# Patient Record
Sex: Female | Born: 1975 | Race: Black or African American | Hispanic: No | Marital: Married | Smoking: Current every day smoker
Health system: Southern US, Community
[De-identification: ages and names within clinical notes are randomized; demographics above are authoritative.]

## PROBLEM LIST (undated history)

## (undated) DIAGNOSIS — E119 Type 2 diabetes mellitus without complications: Secondary | ICD-10-CM

---

## 2019-05-07 ENCOUNTER — Ambulatory Visit: Payer: Self-pay | Admitting: Family Medicine

## 2019-05-08 ENCOUNTER — Ambulatory Visit: Payer: Self-pay | Admitting: Family Medicine

## 2019-05-12 ENCOUNTER — Encounter (HOSPITAL_COMMUNITY): Payer: Self-pay | Admitting: Emergency Medicine

## 2019-05-12 ENCOUNTER — Emergency Department (HOSPITAL_COMMUNITY)
Admission: EM | Admit: 2019-05-12 | Discharge: 2019-05-12 | Disposition: A | Discharge status: Home / Self Care | Payer: Medicaid Other | Attending: Emergency Medicine | Admitting: Emergency Medicine

## 2019-05-12 ENCOUNTER — Other Ambulatory Visit: Payer: Self-pay

## 2019-05-12 DIAGNOSIS — R739 Hyperglycemia, unspecified: Secondary | ICD-10-CM

## 2019-05-12 DIAGNOSIS — E1165 Type 2 diabetes mellitus with hyperglycemia: Secondary | ICD-10-CM | POA: Diagnosis present

## 2019-05-12 DIAGNOSIS — F17210 Nicotine dependence, cigarettes, uncomplicated: Secondary | ICD-10-CM | POA: Diagnosis not present

## 2019-05-12 HISTORY — DX: Type 2 diabetes mellitus without complications: E11.9

## 2019-05-12 LAB — CBC WITH DIFFERENTIAL/PLATELET
Abs Immature Granulocytes: 0.02 10*3/uL (ref 0.00–0.07)
Basophils Absolute: 0.1 10*3/uL (ref 0.0–0.1)
Basophils Relative: 1 %
Eosinophils Absolute: 0.1 10*3/uL (ref 0.0–0.5)
Eosinophils Relative: 2 %
HCT: 34.9 % — ABNORMAL LOW (ref 36.0–46.0)
Hemoglobin: 10.9 g/dL — ABNORMAL LOW (ref 12.0–15.0)
Immature Granulocytes: 0 %
Lymphocytes Relative: 42 %
Lymphs Abs: 2.9 10*3/uL (ref 0.7–4.0)
MCH: 27.7 pg (ref 26.0–34.0)
MCHC: 31.2 g/dL (ref 30.0–36.0)
MCV: 88.8 fL (ref 80.0–100.0)
Monocytes Absolute: 0.5 10*3/uL (ref 0.1–1.0)
Monocytes Relative: 7 %
Neutro Abs: 3.3 10*3/uL (ref 1.7–7.7)
Neutrophils Relative %: 48 %
Platelets: 264 10*3/uL (ref 150–400)
RBC: 3.93 MIL/uL (ref 3.87–5.11)
RDW: 14.4 % (ref 11.5–15.5)
WBC: 6.9 10*3/uL (ref 4.0–10.5)
nRBC: 0 % (ref 0.0–0.2)

## 2019-05-12 LAB — CBG MONITORING, ED: Glucose-Capillary: 179 mg/dL — ABNORMAL HIGH (ref 70–99)

## 2019-05-12 LAB — BASIC METABOLIC PANEL
Anion gap: 5 (ref 5–15)
BUN: 18 mg/dL (ref 6–20)
CO2: 23 mmol/L (ref 22–32)
Calcium: 8.2 mg/dL — ABNORMAL LOW (ref 8.9–10.3)
Chloride: 107 mmol/L (ref 98–111)
Creatinine, Ser: 0.87 mg/dL (ref 0.44–1.00)
GFR calc Af Amer: >60 mL/min (ref >60)
GFR calc non Af Amer: >60 mL/min (ref >60)
Glucose, Bld: 365 mg/dL — ABNORMAL HIGH (ref 70–99)
Potassium: 3.7 mmol/L (ref 3.5–5.1)
Sodium: 135 mmol/L (ref 135–145)

## 2019-05-12 MED ORDER — INSULIN ASPART 100 UNIT/ML ~~LOC~~ SOLN
10.0000 [IU] | Freq: Once | SUBCUTANEOUS | Status: AC
  Administered 2019-05-12: 10 [IU] via INTRAVENOUS
  Filled 2019-05-12: qty 1

## 2019-05-12 MED ORDER — METFORMIN HCL 500 MG PO TABS
1000.0000 mg | ORAL_TABLET | Freq: Once | ORAL | Status: AC
  Administered 2019-05-12: 1000 mg via ORAL
  Filled 2019-05-12: qty 2

## 2019-05-12 MED ORDER — SODIUM CHLORIDE 0.9 % IV BOLUS
1000.0000 mL | Freq: Once | INTRAVENOUS | Status: AC
  Administered 2019-05-12: 1000 mL via INTRAVENOUS

## 2019-05-12 MED ORDER — SODIUM CHLORIDE 0.9 % IV BOLUS
1000.0000 mL | Freq: Once | INTRAVENOUS | Status: AC
  Administered 2019-05-12: 02:00:00 1000 mL via INTRAVENOUS

## 2019-05-12 MED ORDER — METFORMIN HCL 1000 MG PO TABS: 1000.0000 mg | ORAL_TABLET | Freq: Two times a day (BID) | ORAL | 4 refills | Status: DC

## 2019-05-12 NOTE — ED Triage Notes (Signed)
[facility] - pt states she ran out of her metformin about 1 month ago. CBG 465

## 2019-05-12 NOTE — ED Provider Notes (Signed)
[facility] EMERGENCY DEPARTMENT Provider Note   CSN: [id number] Arrival date & time: 05/12/19  0100     History   Chief Complaint Chief Complaint  Patient presents with  . Hyperglycemia    HPI Destiny Roberts is a 43 y.o. female.     Patient presents to the emergency department for evaluation of elevated blood sugar.  Patient does have a history of diabetes, recently moved from [place] and does not have a doctor.  She ran out of her Glucophage.  Patient does endorse blurred vision, polydipsia, polyuria but has not had any other symptoms.  She denies fever, flu, COVID symptoms.  No abdominal pain, nausea or vomiting.     Past Medical History:  Diagnosis Date  . Diabetes mellitus without complication ([place])     There are no active problems to display for this patient.   Past Surgical History:  Procedure Laterality Date  . CESAREAN SECTION       OB History   No obstetric history on file.      Home Medications    Prior to Admission medications   Medication Sig Start Date End Date Taking? Authorizing Provider  metFORMIN (GLUCOPHAGE) 1000 MG tablet Take 1 tablet (1,000 mg total) by mouth 2 (two) times daily. 05/12/19   Orpah Greek, MD    Family History No family history on file.  Social History Social History   Tobacco Use  . Smoking status: Current Every Day Smoker    Packs/day: 0.50  . Smokeless tobacco: Never Used  Substance Use Topics  . Alcohol use: Not Currently  . Drug use: Never     Allergies   Amoxicillin   Review of Systems Review of Systems  Eyes: Positive for visual disturbance.  Endocrine: Positive for polydipsia and polyuria.  All other systems reviewed and are negative.    Physical Exam Updated Vital Signs BP 108/80   Pulse 79   Temp 98.2 F (36.8 C)   Resp 16   Ht 5\' 5"  (1.651 m)   Wt 83.9 kg   LMP 04/29/2019 (Exact Date)   SpO2 99%   BMI 30.79 kg/m   Physical Exam Vitals signs and nursing note  reviewed.  Constitutional:      General: She is not in acute distress.    Appearance: Normal appearance. She is well-developed.  HENT:     Head: Normocephalic and atraumatic.     Right Ear: Hearing normal.     Left Ear: Hearing normal.     Nose: Nose normal.  Eyes:     Conjunctiva/sclera: Conjunctivae normal.     Pupils: Pupils are equal, round, and reactive to light.  Neck:     Musculoskeletal: Normal range of motion and neck supple.  Cardiovascular:     Rate and Rhythm: Regular rhythm.     Heart sounds: S1 normal and S2 normal. No murmur. No friction rub. No gallop.   Pulmonary:     Effort: Pulmonary effort is normal. No respiratory distress.     Breath sounds: Normal breath sounds.  Chest:     Chest wall: No tenderness.  Abdominal:     General: Bowel sounds are normal.     Palpations: Abdomen is soft.     Tenderness: There is no abdominal tenderness. There is no guarding or rebound. Negative signs include Murphy's sign and McBurney's sign.     Hernia: No hernia is present.  Musculoskeletal: Normal range of motion.  Skin:    General: Skin is  warm and dry.     Findings: No rash.  Neurological:     Mental Status: She is alert and oriented to person, place, and time.     GCS: GCS eye subscore is 4. GCS verbal subscore is 5. GCS motor subscore is 6.     Cranial Nerves: No cranial nerve deficit.     Sensory: No sensory deficit.     Coordination: Coordination normal.  Psychiatric:        Speech: Speech normal.        Behavior: Behavior normal.        Thought Content: Thought content normal.      ED Treatments / Results  Labs (all labs ordered are listed, but only abnormal results are displayed) Labs Reviewed  CBC WITH DIFFERENTIAL/PLATELET - Abnormal; Notable for the following components:      Result Value   Hemoglobin 10.9 (*)    HCT 34.9 (*)    All other components within normal limits  BASIC METABOLIC PANEL - Abnormal; Notable for the following components:    Glucose, Bld 365 (*)    Calcium 8.2 (*)    All other components within normal limits  CBG MONITORING, ED - Abnormal; Notable for the following components:   Glucose-Capillary 179 (*)    All other components within normal limits  URINALYSIS, ROUTINE W REFLEX MICROSCOPIC    EKG None  Radiology No results found.  Procedures Procedures (including critical care time)  Medications Ordered in ED Medications  metFORMIN (GLUCOPHAGE) tablet 1,000 mg (has no administration in time range)  sodium chloride 0.9 % bolus 1,000 mL (0 mLs Intravenous Stopped 05/12/19 0334)    Followed by  sodium chloride 0.9 % bolus 1,000 mL (0 mLs Intravenous Stopped 05/12/19 0254)  insulin aspart (novoLOG) injection 10 Units (10 Units Intravenous Given 05/12/19 0143)     Initial Impression / Assessment and Plan / ED Course  I have reviewed the triage vital signs and the nursing notes.  Pertinent labs & imaging results that were available during my care of the patient were reviewed by me and considered in my medical decision making (see chart for details).        Patient presents to the emergency department for evaluation of elevated blood sugar secondary to running out of her metformin.  Blood sugar 465 at arrival, no evidence of ketosis.  Anion gap is normal.  Patient administered IV fluids and insulin with resolution of her hyperglycemia.  Patient otherwise has a normal exam and vital signs are normal.  She will be discharged, restart metformin.  Final Clinical Impressions(s) / ED Diagnoses   Final diagnoses:  Hyperglycemia    ED Discharge Orders         Ordered    metFORMIN (GLUCOPHAGE) 1000 MG tablet  2 times daily     05/12/19 0413           Gilda Crease, MD 05/12/19 [phone]

## 2019-05-13 ENCOUNTER — Ambulatory Visit: Payer: Self-pay | Admitting: Family Medicine

## 2019-05-14 ENCOUNTER — Ambulatory Visit: Payer: Self-pay | Admitting: Family Medicine

## 2019-06-27 ENCOUNTER — Encounter (HOSPITAL_COMMUNITY): Payer: Self-pay | Admitting: Emergency Medicine

## 2019-06-27 ENCOUNTER — Other Ambulatory Visit: Payer: Self-pay

## 2019-06-27 ENCOUNTER — Emergency Department (HOSPITAL_COMMUNITY)
Admission: EM | Admit: 2019-06-27 | Discharge: 2019-06-27 | Disposition: A | Discharge status: Home / Self Care | Payer: Medicaid Other | Attending: Emergency Medicine | Admitting: Emergency Medicine

## 2019-06-27 DIAGNOSIS — F17200 Nicotine dependence, unspecified, uncomplicated: Secondary | ICD-10-CM | POA: Diagnosis not present

## 2019-06-27 DIAGNOSIS — Z7984 Long term (current) use of oral hypoglycemic drugs: Secondary | ICD-10-CM | POA: Diagnosis not present

## 2019-06-27 DIAGNOSIS — E119 Type 2 diabetes mellitus without complications: Secondary | ICD-10-CM | POA: Diagnosis not present

## 2019-06-27 DIAGNOSIS — L739 Follicular disorder, unspecified: Secondary | ICD-10-CM | POA: Insufficient documentation

## 2019-06-27 DIAGNOSIS — R112 Nausea with vomiting, unspecified: Secondary | ICD-10-CM

## 2019-06-27 DIAGNOSIS — A059 Bacterial foodborne intoxication, unspecified: Secondary | ICD-10-CM | POA: Diagnosis not present

## 2019-06-27 LAB — CBG MONITORING, ED: Glucose-Capillary: 135 mg/dL — ABNORMAL HIGH (ref 70–99)

## 2019-06-27 MED ORDER — ONDANSETRON 4 MG PO TBDP: 4.0000 mg | ORAL_TABLET | Freq: Three times a day (TID) | ORAL | 0 refills | Status: DC | PRN

## 2019-06-27 MED ORDER — ONDANSETRON 4 MG PO TBDP
4.0000 mg | ORAL_TABLET | Freq: Once | ORAL | Status: AC
  Administered 2019-06-27: 4 mg via ORAL
  Filled 2019-06-27: qty 1

## 2019-06-27 MED ORDER — SULFAMETHOXAZOLE-TRIMETHOPRIM 800-160 MG PO TABS: 1.0000 | ORAL_TABLET | Freq: Two times a day (BID) | ORAL | 0 refills | Status: AC

## 2019-06-27 NOTE — ED Triage Notes (Signed)
PT states she made her and her family deli beef sandwiches yesterday evening and herself along with 3 other family members starting having n/v/d last night. PT states nausea continues and soreness to abdomen with loose stools.

## 2019-06-27 NOTE — ED Provider Notes (Signed)
[facility] EMERGENCY DEPARTMENT Provider Note   CSN: [id number] Arrival date & time: 06/27/19  1354     History   Chief Complaint Chief Complaint  Patient presents with  . Emesis    HPI Destiny Roberts is a 43 y.o. female.  Patient presents with 3 family members all who have suspected food poisoning with symptoms including nausea with emesis and crampy abdominal pain with multiple episodes of diarrhea which woke the family late last night.  She purchase packaged sandwich beef from a local grocery store and made sandwiches for dinner last night.  She states she was in a hurry and was not paying attention, but in hindsight recognizes the meat was moldy.  She denies fevers or chills, she reports 3-4 episodes of vomiting, no mostly dry heaves and approximately 6 episodes of diarrhea.  She denies weakness or dizziness.  She has mild epigastric discomfort.  She has been unable to tolerate any p.o. intake.  She has been urinating regularly she has had no medications for her symptoms prior to arrival.  Additionally she has complaint of tender nodules in her left axilla which have been present for several weeks.  There is been no drainage from the sites, she denies prior episodes of similar complaint.     The history is provided by the patient.    Past Medical History:  Diagnosis Date  . Diabetes mellitus without complication (HCC)     There are no active problems to display for this patient.   Past Surgical History:  Procedure Laterality Date  . CESAREAN SECTION       OB History   No obstetric history on file.      Home Medications    Prior to Admission medications   Medication Sig Start Date End Date Taking? Authorizing Provider  metFORMIN (GLUCOPHAGE) 1000 MG tablet Take 1 tablet (1,000 mg total) by mouth 2 (two) times daily. 05/12/19   Gilda Crease, MD  ondansetron (ZOFRAN ODT) 4 MG disintegrating tablet Take 1 tablet (4 mg total) by mouth every 8 (eight) hours as  needed for nausea or vomiting. 06/27/19   Burgess Amor, PA-C  sulfamethoxazole-trimethoprim (BACTRIM DS) 800-160 MG tablet Take 1 tablet by mouth 2 (two) times daily for 10 days. 06/27/19 07/07/19  Burgess Amor, PA-C    Family History History reviewed. No pertinent family history.  Social History Social History   Tobacco Use  . Smoking status: Current Every Day Smoker    Packs/day: 0.50  . Smokeless tobacco: Never Used  Substance Use Topics  . Alcohol use: Not Currently  . Drug use: Never     Allergies   Amoxicillin   Review of Systems Review of Systems  Constitutional: Negative for chills and fever.  HENT: Negative for congestion and sore throat.   Eyes: Negative.   Respiratory: Negative for chest tightness and shortness of breath.   Cardiovascular: Negative for chest pain.  Gastrointestinal: Positive for diarrhea, nausea and vomiting. Negative for abdominal pain.  Genitourinary: Negative.  Negative for decreased urine volume and dysuria.  Musculoskeletal: Negative for arthralgias, joint swelling and neck pain.  Skin: Negative for rash.       Negative except as mentioned in HPI.   Neurological: Negative for dizziness, weakness, light-headedness, numbness and headaches.  Psychiatric/Behavioral: Negative.      Physical Exam Updated Vital Signs BP (!) 129/98 (BP Location: Right Arm)   Pulse 100   Temp 98.6 F (37 C) (Oral)   Resp 18   Ht  5' (1.524 m)   Wt 81.6 kg   LMP 06/24/2019   SpO2 100%   BMI 35.15 kg/m   Physical Exam Vitals signs and nursing note reviewed.  Constitutional:      Appearance: She is well-developed.  HENT:     Head: Normocephalic and atraumatic.     Mouth/Throat:     Mouth: Mucous membranes are moist.  Eyes:     Conjunctiva/sclera: Conjunctivae normal.  Neck:     Musculoskeletal: Normal range of motion.  Cardiovascular:     Rate and Rhythm: Normal rate and regular rhythm.     Heart sounds: Normal heart sounds.  Pulmonary:      Effort: Pulmonary effort is normal.     Breath sounds: Normal breath sounds. No wheezing.  Abdominal:     General: Bowel sounds are normal.     Palpations: Abdomen is soft.     Tenderness: There is abdominal tenderness. There is no guarding or rebound.     Comments:   Mild tenderness in the epigastric region with no guarding or rebound.  Abdomen is soft and nondistended.  Musculoskeletal: Normal range of motion.  Skin:    General: Skin is warm and dry.     Comments: Left axilla has 3 papules in the axillary crease, there is no fluctuance or abscess appreciated.  No erythema or red streaking, no lymphadenopathy.  Neurological:     Mental Status: She is alert.      ED Treatments / Results  Labs (all labs ordered are listed, but only abnormal results are displayed) Labs Reviewed  CBG MONITORING, ED - Abnormal; Notable for the following components:      Result Value   Glucose-Capillary 135 (*)    All other components within normal limits    EKG None  Radiology No results found.  Procedures Procedures (including critical care time)  Medications Ordered in ED Medications  ondansetron (ZOFRAN-ODT) disintegrating tablet 4 mg (4 mg Oral Given 06/27/19 1748)     Initial Impression / Assessment and Plan / ED Course  I have reviewed the triage vital signs and the nursing notes.  Pertinent labs & imaging results that were available during my care of the patient were reviewed by me and considered in my medical decision making (see chart for details).       Recommended lab work to evaluate electrolyte and hydration status.  Patient refused. Patient was given p.o. Zofran here and was able to tolerate p.o. intake after this medication.  VSS and appears in no acute distress.  Her abdomen is soft, no peritoneal signs.  She was advised to continue Zofran and increase p.o. fluid intake.  Advised to avoid antidiarrheals.  Also recommended recheck within 24 to 48 hours if the diarrhea is  not resolving or if there is any worsening of the symptom.   Patient was also placed on a course of Bactrim for left axillary folliculitis.  Discussed warm compresses to the site.  Final Clinical Impressions(s) / ED Diagnoses   Final diagnoses:  Nausea vomiting and diarrhea  Food poisoning  Folliculitis    ED Discharge Orders         Ordered    ondansetron (ZOFRAN ODT) 4 MG disintegrating tablet  Every 8 hours PRN     06/27/19 1838    sulfamethoxazole-trimethoprim (BACTRIM DS) 800-160 MG tablet  2 times daily     06/27/19 1841           Burgess Amordol, Matilynn Dacey, PA-C 06/27/19 1841  Milton Ferguson, MD 06/27/19 [phone]

## 2019-06-27 NOTE — ED Notes (Signed)
Pt refusing labs at this time.

## 2019-06-27 NOTE — Discharge Instructions (Signed)
Use the Zofran as prescribed if needed for continued nausea or vomiting.  Is important for you to drink lots of fluids to replace fluid loss, especially if you continue to have diarrhea.  This symptom should not be treated unless you have worsening diarrhea of more than one episode an hour first several hours in a row.  If this occurs or if you develop fevers or increasing abdominal pain you will need to return here for reevaluation and blood work.   Take the antibiotic for the folliculitis in your left axilla.  Also apply warm compresses to your left axilla several times daily to help resolve this skin infection.

## 2019-09-19 ENCOUNTER — Emergency Department (HOSPITAL_COMMUNITY)
Admission: EM | Admit: 2019-09-19 | Discharge: 2019-09-20 | Disposition: A | Discharge status: Home / Self Care | Payer: Medicaid Other | Attending: Emergency Medicine | Admitting: Emergency Medicine

## 2019-09-19 ENCOUNTER — Encounter (HOSPITAL_COMMUNITY): Payer: Self-pay | Admitting: Emergency Medicine

## 2019-09-19 ENCOUNTER — Other Ambulatory Visit: Payer: Self-pay

## 2019-09-19 DIAGNOSIS — Y999 Unspecified external cause status: Secondary | ICD-10-CM | POA: Diagnosis not present

## 2019-09-19 DIAGNOSIS — E119 Type 2 diabetes mellitus without complications: Secondary | ICD-10-CM | POA: Diagnosis not present

## 2019-09-19 DIAGNOSIS — W57XXXA Bitten or stung by nonvenomous insect and other nonvenomous arthropods, initial encounter: Secondary | ICD-10-CM | POA: Diagnosis not present

## 2019-09-19 DIAGNOSIS — Y92008 Other place in unspecified non-institutional (private) residence as the place of occurrence of the external cause: Secondary | ICD-10-CM | POA: Insufficient documentation

## 2019-09-19 DIAGNOSIS — S30861A Insect bite (nonvenomous) of abdominal wall, initial encounter: Secondary | ICD-10-CM | POA: Diagnosis not present

## 2019-09-19 DIAGNOSIS — L02211 Cutaneous abscess of abdominal wall: Secondary | ICD-10-CM | POA: Diagnosis not present

## 2019-09-19 DIAGNOSIS — Y9389 Activity, other specified: Secondary | ICD-10-CM | POA: Insufficient documentation

## 2019-09-19 DIAGNOSIS — Z79899 Other long term (current) drug therapy: Secondary | ICD-10-CM | POA: Insufficient documentation

## 2019-09-19 DIAGNOSIS — F17210 Nicotine dependence, cigarettes, uncomplicated: Secondary | ICD-10-CM | POA: Diagnosis not present

## 2019-09-19 NOTE — ED Triage Notes (Signed)
Pt C/O drainage from an insect bite on her abdomen. Pt denies fever, chills, N/V.

## 2019-09-20 MED ORDER — CLINDAMYCIN HCL 150 MG PO CAPS: 450.0000 mg | ORAL_CAPSULE | Freq: Three times a day (TID) | ORAL | 0 refills | Status: AC

## 2019-09-20 MED ORDER — LIDOCAINE-EPINEPHRINE (PF) 2 %-1:200000 IJ SOLN
10.0000 mL | Freq: Once | INTRAMUSCULAR | Status: AC
  Administered 2019-09-20: 10 mL via INTRADERMAL
  Filled 2019-09-20: qty 10

## 2019-09-20 MED ORDER — METFORMIN HCL 1000 MG PO TABS: 1000.0000 mg | ORAL_TABLET | Freq: Two times a day (BID) | ORAL | 0 refills | Status: DC

## 2019-09-20 NOTE — Discharge Instructions (Addendum)
Please read the attachment on care following incision and drainage.  It is important that you follow-up with your primary care provider in 2 to 3 days regarding today's encounter for wound recheck.  Please take your Clindamycin medication, as prescribed.  It is an antibiotic that will help with your surrounding cellulitis.  I have also refilled your Metformin, as prescribed, to last you until your subsequent appointment.  Please return to the ED or seek immediate medical attention for any new or worsening symptoms.

## 2019-09-20 NOTE — ED Provider Notes (Signed)
[facility] EMERGENCY DEPARTMENT Provider Note   CSN: [id number] Arrival date & time: 09/19/19  2243     History Chief Complaint  Patient presents with  . Insect Bite    Destiny Roberts is a 44 y.o. female with PMH significant for diabetes who comes to the ED with a 2-day history of insect bite to her abdomen.  Patient reports that she was bitten by an insect, but she is unsure what it was.  Her husband thought that he saw a wolf spider.  She notes that she has had some surrounding firmness and some mild discharge that she has been squeezing out over the wound.  She denies any fevers or chills, headache or dizziness, spreading redness, or other concerning systemic illness.  She has not yet applied any warm compresses.  HPI     Past Medical History:  Diagnosis Date  . Diabetes mellitus without complication ([facility])     There are no problems to display for this patient.   Past Surgical History:  Procedure Laterality Date  . CESAREAN SECTION       OB History   No obstetric history on file.     No family history on file.  Social History   Tobacco Use  . Smoking status: Current Every Day Smoker    Packs/day: 0.50  . Smokeless tobacco: Never Used  Substance Use Topics  . Alcohol use: Not Currently  . Drug use: Never    Home Medications Prior to Admission medications   Medication Sig Start Date End Date Taking? Authorizing Provider  clindamycin (CLEOCIN) 150 MG capsule Take 3 capsules (450 mg total) by mouth 3 (three) times daily for 5 days. 09/20/19 09/25/19  Lorelee New, PA-C  metFORMIN (GLUCOPHAGE) 1000 MG tablet Take 1 tablet (1,000 mg total) by mouth 2 (two) times daily. 09/20/19   Lorelee New, PA-C  ondansetron (ZOFRAN ODT) 4 MG disintegrating tablet Take 1 tablet (4 mg total) by mouth every 8 (eight) hours as needed for nausea or vomiting. 06/27/19   Burgess Amor, PA-C    Allergies    Amoxicillin  Review of Systems   Review of Systems  Constitutional:  Negative for chills and fever.  Gastrointestinal: Negative for abdominal pain and nausea.  Skin: Positive for color change and wound.  Neurological: Negative for numbness.    Physical Exam Updated Vital Signs BP (!) 143/81 (BP Location: Right Arm)   Pulse 84   Temp 98.3 F (36.8 C) (Oral)   Resp 16   Wt 81.6 kg   LMP 09/13/2019   SpO2 99%   BMI 35.13 kg/m   Physical Exam Vitals and nursing note reviewed. Exam conducted with a chaperone present.  Constitutional:      Appearance: Normal appearance.  HENT:     Head: Normocephalic and atraumatic.  Eyes:     General: No scleral icterus.    Conjunctiva/sclera: Conjunctivae normal.  Cardiovascular:     Rate and Rhythm: Normal rate and regular rhythm.     Pulses: Normal pulses.     Heart sounds: Normal heart sounds.  Pulmonary:     Effort: Pulmonary effort is normal.     Breath sounds: Normal breath sounds.  Musculoskeletal:     Cervical back: Normal range of motion and neck supple.  Skin:    Comments: 1 x 2 cm pink area with central lesion concerning for spider bite.  Mild surrounding induration, however no significant swelling or spreading erythema.  No obvious fluctuance noted,  however evidence of drainage.  Neurological:     Mental Status: She is alert.     GCS: GCS eye subscore is 4. GCS verbal subscore is 5. GCS motor subscore is 6.  Psychiatric:        Mood and Affect: Mood normal.        Behavior: Behavior normal.        Thought Content: Thought content normal.         ED Results / Procedures / Treatments   Labs (all labs ordered are listed, but only abnormal results are displayed) Labs Reviewed - No data to display  EKG None  Radiology No results found.  Procedures .Marland KitchenIncision and Drainage  Date/Time: 09/20/2019 1:07 AM Performed by: Lorelee New, PA-C Authorized by: Lorelee New, PA-C   Consent:    Consent obtained:  Verbal   Consent given by:  Patient   Risks discussed:  Bleeding,  incomplete drainage, pain, damage to other organs and infection   Alternatives discussed:  No treatment Universal protocol:    Procedure explained and questions answered to patient or proxy's satisfaction: yes     Relevant documents present and verified: yes     Test results available and properly labeled: yes     Imaging studies available: yes     Required blood products, implants, devices, and special equipment available: yes     Site/side marked: yes     Immediately prior to procedure a time out was called: yes     Patient identity confirmed:  Verbally with patient Location:    Type:  Abscess   Size:  1 cm   Location:  Trunk   Trunk location:  Abdomen Pre-procedure details:    Skin preparation:  Betadine Anesthesia (see MAR for exact dosages):    Anesthesia method:  Local infiltration   Local anesthetic:  Lidocaine 2% WITH epi Procedure type:    Complexity:  Simple Procedure details:    Incision types:  Stab incision   Incision depth:  Subcutaneous   Scalpel blade:  11   Wound management:  Probed and deloculated, irrigated with saline and extensive cleaning   Drainage:  Purulent and bloody   Drainage amount:  Scant   Wound treatment:  Wound left open   Packing materials:  None Post-procedure details:    Patient tolerance of procedure:  Tolerated well, no immediate complications   (including critical care time)  Medications Ordered in ED Medications  lidocaine-EPINEPHrine (XYLOCAINE W/EPI) 2 %-1:200000 (PF) injection 10 mL (has no administration in time range)    ED Course  I have reviewed the triage vital signs and the nursing notes.  Pertinent labs & imaging results that were available during my care of the patient were reviewed by me and considered in my medical decision making (see chart for details).    MDM Rules/Calculators/A&P                      Patient with skin abscess amenable to incision and drainage.  Abscess was not large enough to warrant packing  or drain,  wound recheck in 2 days. Encouraged home warm soaks and flushing.  Mild signs of cellulitis is surrounding skin.  Will d/c to home with Clindamycin prescription.  Patient also reports that she is out of her Metformin and takes 1000 mg twice daily.  She will have her follow-up appoint with her primary care provider in approximately 12 days who will subsequently refill her medication.  Discussed  return precautions with the patient.  She voiced understanding is agreeable to plan.  She is hemodynamically stable and denies any and all other complaints at this time.  Final Clinical Impression(s) / ED Diagnoses Final diagnoses:  Insect bite of abdominal wall, initial encounter    Rx / DC Orders ED Discharge Orders         Ordered    clindamycin (CLEOCIN) 150 MG capsule  3 times daily     09/20/19 0111    metFORMIN (GLUCOPHAGE) 1000 MG tablet  2 times daily     09/20/19 0112           Corena Herter, PA-C 09/20/19 0112    Mesner, Corene Cornea, MD 09/20/19 [phone]

## 2019-10-06 ENCOUNTER — Ambulatory Visit
Admission: EM | Admit: 2019-10-06 | Discharge: 2019-10-06 | Disposition: A | Discharge status: Home / Self Care | Payer: Medicaid Other | Attending: Emergency Medicine | Admitting: Emergency Medicine

## 2019-10-06 ENCOUNTER — Other Ambulatory Visit: Payer: Self-pay

## 2019-10-06 DIAGNOSIS — Z760 Encounter for issue of repeat prescription: Secondary | ICD-10-CM

## 2019-10-06 LAB — POCT FASTING CBG KUC MANUAL ENTRY: POCT Glucose (KUC): 222 mg/dL — AB (ref 70–99)

## 2019-10-06 MED ORDER — ASPIRIN 81 MG PO CHEW: 81.0000 mg | CHEWABLE_TABLET | Freq: Every day | ORAL | 0 refills | Status: AC

## 2019-10-06 MED ORDER — VITAMIN D 600 IU CAPSULE SWOG S0812: 600.0000 [IU] | ORAL_CAPSULE | Freq: Every day | ORAL | 0 refills | Status: DC

## 2019-10-06 MED ORDER — METFORMIN HCL 850 MG PO TABS: 850.0000 mg | ORAL_TABLET | Freq: Two times a day (BID) | ORAL | 1 refills | Status: DC

## 2019-10-06 NOTE — ED Triage Notes (Signed)
Pt presents with c/o  Feeling unwell for past few weeks , pt states she has diabetes but doesn't have testing materials

## 2019-10-06 NOTE — ED Provider Notes (Signed)
[facility]    CSN: [id number] Arrival date & time: 10/06/19  1215      History   Chief Complaint No chief complaint on file.   HPI Destiny Roberts is a 44 y.o. female.   who presents for medication refill.  Hx of diabetes for 20 years.  States she ran out of her Metformin, baby aspirin and vitamin D.  Does have a PCP.  Denies HA, vision changes, dizziness, lightheadedness, chest pain, shortness of breath, numbness or tingling in extremities, abdominal pain, changes in bowel or bladder habits.    The history is provided by the patient. No language interpreter was used.    Past Medical History:  Diagnosis Date  . Diabetes mellitus without complication ([place])     There are no problems to display for this patient.   Past Surgical History:  Procedure Laterality Date  . CESAREAN SECTION      OB History   No obstetric history on file.      Home Medications    Prior to Admission medications   Medication Sig Start Date End Date Taking? Authorizing Provider  aspirin (ASPIRIN 81) 81 MG chewable tablet Chew 1 tablet (81 mg total) by mouth daily. 10/06/19   Nikola Blackston, Darrelyn Hillock, FNP  metFORMIN (GLUCOPHAGE) 850 MG tablet Take 1 tablet (850 mg total) by mouth 2 (two) times daily with a meal. 10/06/19   Coreon Simkins, Darrelyn Hillock, FNP  ondansetron (ZOFRAN ODT) 4 MG disintegrating tablet Take 1 tablet (4 mg total) by mouth every 8 (eight) hours as needed for nausea or vomiting. 06/27/19   Evalee Jefferson, PA-C    Family History Family History  Problem Relation Age of Onset  . Diabetes Mother   . Diabetes Father     Social History Social History   Tobacco Use  . Smoking status: Current Every Day Smoker    Packs/day: 0.50  . Smokeless tobacco: Never Used  Substance Use Topics  . Alcohol use: Not Currently  . Drug use: Never     Allergies   Amoxicillin   Review of Systems Review of Systems  Constitutional: Negative.   Respiratory: Negative.   Cardiovascular:  Negative.   Gastrointestinal: Negative.      Physical Exam Triage Vital Signs ED Triage Vitals  Enc Vitals Group     BP 10/06/19 1225 112/78     Pulse Rate 10/06/19 1225 84     Resp 10/06/19 1225 18     Temp 10/06/19 1225 98.5 F (36.9 C)     Temp src --      SpO2 10/06/19 1225 97 %     Weight --      Height --      Head Circumference --      Peak Flow --      Pain Score 10/06/19 1222 0     Pain Loc --      Pain Edu? --      Excl. in [place]? --    No data found.  Updated Vital Signs BP 112/78   Pulse 84   Temp 98.5 F (36.9 C)   Resp 18   LMP 09/13/2019   SpO2 97%   Visual Acuity Right Eye Distance:   Left Eye Distance:   Bilateral Distance:    Right Eye Near:   Left Eye Near:    Bilateral Near:     Physical Exam Vitals and nursing note reviewed.  Constitutional:      General: She is not in  acute distress.    Appearance: Normal appearance. She is normal weight. She is not ill-appearing or toxic-appearing.  Cardiovascular:     Rate and Rhythm: Normal rate and regular rhythm.     Pulses: Normal pulses.     Heart sounds: Normal heart sounds. No murmur.  Pulmonary:     Effort: Pulmonary effort is normal. No respiratory distress.     Breath sounds: Normal breath sounds. No wheezing, rhonchi or rales.  Chest:     Chest wall: No tenderness.  Abdominal:     General: Abdomen is flat. Bowel sounds are normal. There is no distension.     Palpations: Abdomen is soft. There is no mass.     Tenderness: There is no abdominal tenderness. There is no guarding or rebound.     Hernia: No hernia is present.  Neurological:     Mental Status: She is alert.      UC Treatments / Results  Labs (all labs ordered are listed, but only abnormal results are displayed) Labs Reviewed  POCT FASTING CBG [facility] MANUAL ENTRY - Abnormal; Notable for the following components:      Result Value   POCT Glucose ([facility]) 222 (*)    All other components within normal limits     EKG   Radiology No results found.  Procedures Procedures (including critical care time)  Medications Ordered in UC Medications - No data to display  Initial Impression / Assessment and Plan / UC Course  I have reviewed the triage vital signs and the nursing notes.  Pertinent labs & imaging results that were available during my care of the patient were reviewed by me and considered in my medical decision making (see chart for details).     Patient is stable for discharge. Metformin was refilled Aspirin was refilled Follow-up with primary care Return for worsening of symptoms  Final Clinical Impressions(s) / UC Diagnoses   Final diagnoses:  Encounter for medication refill     Discharge Instructions     Take medication as prescribed Follow-up with primary care PCP resource was given Return for worsening symptoms    ED Prescriptions    Medication Sig Dispense Auth. Provider   metFORMIN (GLUCOPHAGE) 850 MG tablet Take 1 tablet (850 mg total) by mouth 2 (two) times daily with a meal. 60 tablet Avant Printy, Zachery Dakins, FNP   aspirin (ASPIRIN 81) 81 MG chewable tablet Chew 1 tablet (81 mg total) by mouth daily. 30 tablet Durward Parcel, FNP   Investigational vitamin D 600 UNITS capsule SWOG [phone]  (Status: Discontinued) Take 1 capsule (600 Units total) by mouth daily. Take with food. 30 capsule Joslynn Jamroz, Zachery Dakins, FNP     PDMP not reviewed this encounter.   Durward Parcel, FNP 10/06/19 1309

## 2019-10-06 NOTE — Discharge Instructions (Addendum)
Take medication as prescribed Follow-up with primary care PCP resource was given Return for worsening symptoms

## 2019-10-18 ENCOUNTER — Emergency Department (HOSPITAL_COMMUNITY)
Admission: EM | Admit: 2019-10-18 | Discharge: 2019-10-18 | Disposition: A | Discharge status: Home / Self Care | Payer: Medicaid Other | Attending: Emergency Medicine | Admitting: Emergency Medicine

## 2019-10-18 ENCOUNTER — Encounter (HOSPITAL_COMMUNITY): Payer: Self-pay

## 2019-10-18 ENCOUNTER — Other Ambulatory Visit: Payer: Self-pay

## 2019-10-18 DIAGNOSIS — Z7984 Long term (current) use of oral hypoglycemic drugs: Secondary | ICD-10-CM | POA: Diagnosis not present

## 2019-10-18 DIAGNOSIS — F17210 Nicotine dependence, cigarettes, uncomplicated: Secondary | ICD-10-CM | POA: Insufficient documentation

## 2019-10-18 DIAGNOSIS — J029 Acute pharyngitis, unspecified: Secondary | ICD-10-CM | POA: Diagnosis present

## 2019-10-18 DIAGNOSIS — E1165 Type 2 diabetes mellitus with hyperglycemia: Secondary | ICD-10-CM | POA: Diagnosis not present

## 2019-10-18 DIAGNOSIS — R739 Hyperglycemia, unspecified: Secondary | ICD-10-CM

## 2019-10-18 LAB — GROUP A STREP BY PCR: Group A Strep by PCR: NOT DETECTED

## 2019-10-18 LAB — CBG MONITORING, ED: Glucose-Capillary: 281 mg/dL — ABNORMAL HIGH (ref 70–99)

## 2019-10-18 MED ORDER — PENICILLIN G BENZATHINE 1200000 UNIT/2ML IM SUSP
1.2000 10*6.[IU] | Freq: Once | INTRAMUSCULAR | Status: AC
  Administered 2019-10-18: 1.2 10*6.[IU] via INTRAMUSCULAR
  Filled 2019-10-18: qty 2

## 2019-10-18 MED ORDER — ACETAMINOPHEN 500 MG PO TABS
1000.0000 mg | ORAL_TABLET | Freq: Once | ORAL | Status: AC
  Administered 2019-10-18: 1000 mg via ORAL
  Filled 2019-10-18: qty 2

## 2019-10-18 MED ORDER — DEXAMETHASONE SODIUM PHOSPHATE 10 MG/ML IJ SOLN
10.0000 mg | Freq: Once | INTRAMUSCULAR | Status: AC
  Administered 2019-10-18: 10 mg via INTRAMUSCULAR
  Filled 2019-10-18: qty 1

## 2019-10-18 NOTE — Discharge Instructions (Addendum)
Drink plenty of fluids.  Take ibuprofen 600 mg plus acetaminophen 1000 mg every 6 hours as needed for pain or discomfort.  Return to the emergency department if you are unable to swallow, or you have difficulty breathing.  Look over the information for the diabetic diet just to make sure you are following your diet like he should.  Please continue your Metformin and to you have your doctor's appointment in April.

## 2019-10-18 NOTE — ED Triage Notes (Signed)
Sore throat x 2 days, taking otc meds for same.

## 2019-10-18 NOTE — ED Provider Notes (Signed)
[facility] EMERGENCY DEPARTMENT Provider Note   CSN: [id number] Arrival date & time: 10/18/19  2694   Time seen 4:50 AM  History Chief Complaint  Patient presents with  . Sore Throat    Destiny Roberts is a 44 y.o. female.  HPI   Patient states a week ago she started getting pain in her throat.  She states the pain is mainly on the right side and radiates into her right ear.  She denies any fever.  She states the last time she was able to eat or drink was 1 PM on March 5.  She denies rhinorrhea, nausea, or vomiting.  She has a mild cough.  She denies being around anybody else who is sick.  She states she used to get frequent sore throats as a child.  Patient states she is allergic to amoxicillin but she can take penicillin.  Patient states she has been diabetic for about 20 years.  She was seen last month at urgent care and prescribed Metformin.  She has not had a doctor in a year.  She has her first appointment with a new doctor in April but she does not recall the name.  She does not check her blood sugars.  PCP System, Pcp Not In   Past Medical History:  Diagnosis Date  . Diabetes mellitus without complication ([place])     There are no problems to display for this patient.   Past Surgical History:  Procedure Laterality Date  . CESAREAN SECTION       OB History   No obstetric history on file.     Family History  Problem Relation Age of Onset  . Diabetes Mother   . Diabetes Father     Social History   Tobacco Use  . Smoking status: Current Every Day Smoker    Packs/day: 0.50  . Smokeless tobacco: Never Used  Substance Use Topics  . Alcohol use: Not Currently  . Drug use: Never    Home Medications Prior to Admission medications   Medication Sig Start Date End Date Taking? Authorizing Provider  aspirin (ASPIRIN 81) 81 MG chewable tablet Chew 1 tablet (81 mg total) by mouth daily. 10/06/19   Avegno, Darrelyn Hillock, FNP  metFORMIN (GLUCOPHAGE) 850 MG tablet Take 1  tablet (850 mg total) by mouth 2 (two) times daily with a meal. 10/06/19   Avegno, Darrelyn Hillock, FNP  ondansetron (ZOFRAN ODT) 4 MG disintegrating tablet Take 1 tablet (4 mg total) by mouth every 8 (eight) hours as needed for nausea or vomiting. 06/27/19   Evalee Jefferson, PA-C    Allergies    Amoxicillin  Review of Systems   Review of Systems  All other systems reviewed and are negative.   Physical Exam Updated Vital Signs BP 125/87 (BP Location: Left Arm)   Pulse 83   Temp 98.2 F (36.8 C) (Oral)   Resp 16   Ht 5' (1.524 m)   Wt 88 kg   LMP 10/15/2019 (Approximate)   SpO2 100%   BMI 37.89 kg/m    Vital signs normal    Physical Exam Vitals and nursing note reviewed.  Constitutional:      Appearance: Normal appearance.  HENT:     Head: Normocephalic and atraumatic.     Right Ear: External ear normal.     Left Ear: External ear normal.     Nose: Nose normal.     Mouth/Throat:     Mouth: Mucous membranes are moist.  Pharynx: Posterior oropharyngeal erythema present. No oropharyngeal exudate.     Comments: Speech normal, no soft palate swelling, uvula is midline and normal.  She is not spitting in a cup, she is handling her secretions.  She has diffuse redness of both tonsils however the right tonsil is larger than the left.  There is no exudates seen.  Her voice is normal. Eyes:     Extraocular Movements: Extraocular movements intact.     Conjunctiva/sclera: Conjunctivae normal.     Pupils: Pupils are equal, round, and reactive to light.  Neck:     Comments: Patient has small shotty lymphadenopathy bilaterally Cardiovascular:     Rate and Rhythm: Normal rate and regular rhythm.  Pulmonary:     Effort: Pulmonary effort is normal. No respiratory distress.  Musculoskeletal:     Cervical back: Normal range of motion.  Lymphadenopathy:     Cervical: Cervical adenopathy present.  Skin:    General: Skin is warm and dry.  Neurological:     General: No focal deficit  present.     Mental Status: She is alert and oriented to person, place, and time.     Cranial Nerves: No cranial nerve deficit.  Psychiatric:        Mood and Affect: Mood normal.        Behavior: Behavior normal.        Thought Content: Thought content normal.     ED Results / Procedures / Treatments   Labs (all labs ordered are listed, but only abnormal results are displayed) Labs Reviewed  CBG MONITORING, ED - Abnormal; Notable for the following components:      Result Value   Glucose-Capillary 281 (*)    All other components within normal limits  GROUP A STREP BY PCR    EKG None  Radiology No results found.  Procedures Procedures (including critical care time)  Medications Ordered in ED Medications  acetaminophen (TYLENOL) tablet 1,000 mg (1,000 mg Oral Given 10/18/19 0526)  penicillin g benzathine (BICILLIN LA) 1200000 UNIT/2ML injection 1.2 Million Units (1.2 Million Units Intramuscular Given 10/18/19 0624)  dexamethasone (DECADRON) injection 10 mg (10 mg Intramuscular Given 10/18/19 6237)    ED Course  I have reviewed the triage vital signs and the nursing notes.  Pertinent labs & imaging results that were available during my care of the patient were reviewed by me and considered in my medical decision making (see chart for details).  Recheck at 6:15 AM patient's rapid strep is negative.  Her CBG is elevated at 280.  She states that she is following her diabetic diet however I will give her information to refresh her memory at discharge.  I examined her throat again, she has redness around both tonsils, the right tonsil is larger than the left.  Her speech is normal.  She is not having to spit in a cup to handle her saliva.  I am willing to give her a Bicillin LA and 1 dose of the Decadron.  She states she has been taking her Metformin.   MDM Rules/Calculators/A&P                      Final Clinical Impression(s) / ED Diagnoses Final diagnoses:  Sore throat    Hyperglycemia    Rx / DC Orders ED Discharge Orders    None    OTC ibuprofen and acetaminophen  Plan discharge  Devoria Albe, MD, Concha Pyo, MD 10/18/19 [phone]

## 2019-10-20 ENCOUNTER — Encounter (HOSPITAL_COMMUNITY): Payer: Self-pay | Admitting: Emergency Medicine

## 2019-10-20 ENCOUNTER — Other Ambulatory Visit: Payer: Self-pay

## 2019-10-20 ENCOUNTER — Emergency Department (HOSPITAL_COMMUNITY)
Admission: EM | Admit: 2019-10-20 | Discharge: 2019-10-20 | Disposition: A | Discharge status: Home / Self Care | Payer: Medicaid Other | Attending: Emergency Medicine | Admitting: Emergency Medicine

## 2019-10-20 DIAGNOSIS — E119 Type 2 diabetes mellitus without complications: Secondary | ICD-10-CM | POA: Insufficient documentation

## 2019-10-20 DIAGNOSIS — F17210 Nicotine dependence, cigarettes, uncomplicated: Secondary | ICD-10-CM | POA: Insufficient documentation

## 2019-10-20 DIAGNOSIS — H9201 Otalgia, right ear: Secondary | ICD-10-CM

## 2019-10-20 DIAGNOSIS — Z7984 Long term (current) use of oral hypoglycemic drugs: Secondary | ICD-10-CM | POA: Insufficient documentation

## 2019-10-20 DIAGNOSIS — Z7982 Long term (current) use of aspirin: Secondary | ICD-10-CM | POA: Insufficient documentation

## 2019-10-20 DIAGNOSIS — J029 Acute pharyngitis, unspecified: Secondary | ICD-10-CM

## 2019-10-20 NOTE — ED Provider Notes (Signed)
[facility] EMERGENCY DEPARTMENT Provider Note   CSN: [id number] Arrival date & time: 10/20/19  7408     History Chief Complaint  Patient presents with  . Otalgia    Destiny Roberts is a 44 y.o. female.  HPI   Patient states she started having pain in her throat and ear about a week ago.  The pain was on the right side.  Patient was seen in the ED on March 6.  At that time she was noted to have posterior oropharyngeal erythema.  Patient had a rapid strep test that was negative.  She was given a dose of Decadron and Bicillin LA.  Patient came back to the ER today because she still having persistent pain in her ear.  Patient was concerned because she states her ears were not examined.  Patient denies any fevers or chills.  She is taking over-the-counter medications for pain.  She is not have any difficulty swallowing or speaking  Past Medical History:  Diagnosis Date  . Diabetes mellitus without complication ([place])     There are no problems to display for this patient.   Past Surgical History:  Procedure Laterality Date  . CESAREAN SECTION       OB History   No obstetric history on file.     Family History  Problem Relation Age of Onset  . Diabetes Mother   . Diabetes Father     Social History   Tobacco Use  . Smoking status: Current Every Day Smoker    Packs/day: 0.50  . Smokeless tobacco: Never Used  Substance Use Topics  . Alcohol use: Not Currently  . Drug use: Never    Home Medications Prior to Admission medications   Medication Sig Start Date End Date Taking? Authorizing Provider  aspirin (ASPIRIN 81) 81 MG chewable tablet Chew 1 tablet (81 mg total) by mouth daily. 10/06/19   Avegno, Darrelyn Hillock, FNP  metFORMIN (GLUCOPHAGE) 850 MG tablet Take 1 tablet (850 mg total) by mouth 2 (two) times daily with a meal. 10/06/19   Avegno, Darrelyn Hillock, FNP  ondansetron (ZOFRAN ODT) 4 MG disintegrating tablet Take 1 tablet (4 mg total) by mouth every 8 (eight) hours as  needed for nausea or vomiting. 06/27/19   Evalee Jefferson, PA-C    Allergies    Amoxicillin  Review of Systems   Review of Systems  All other systems reviewed and are negative.   Physical Exam Updated Vital Signs BP (!) 142/98   Pulse 84   Temp 98.2 F (36.8 C)   Resp 18   Ht 1.524 m (5')   Wt 87.5 kg   LMP 10/15/2019 (Approximate)   SpO2 100%   BMI 37.69 kg/m   Physical Exam Vitals and nursing note reviewed.  Constitutional:      General: She is not in acute distress.    Appearance: She is well-developed.  HENT:     Head: Normocephalic and atraumatic.     Right Ear: Tympanic membrane and external ear normal.     Left Ear: Tympanic membrane and external ear normal.     Mouth/Throat:     Mouth: Mucous membranes are moist.     Pharynx: No oropharyngeal exudate or posterior oropharyngeal erythema.  Eyes:     General: No scleral icterus.       Right eye: No discharge.        Left eye: No discharge.     Conjunctiva/sclera: Conjunctivae normal.  Neck:     Trachea:  No tracheal deviation.  Cardiovascular:     Rate and Rhythm: Normal rate.  Pulmonary:     Effort: Pulmonary effort is normal. No respiratory distress.     Breath sounds: No stridor.  Abdominal:     General: There is no distension.  Musculoskeletal:        General: No swelling or deformity.     Cervical back: Neck supple.  Skin:    General: Skin is warm and dry.     Findings: No rash.  Neurological:     Mental Status: She is alert.     Cranial Nerves: Cranial nerve deficit: no gross deficits.     ED Results / Procedures / Treatments   Labs (all labs ordered are listed, but only abnormal results are displayed) Labs Reviewed - No data to display  EKG None  Radiology No results found.  Procedures Procedures (including critical care time)  Medications Ordered in ED Medications - No data to display  ED Course  I have reviewed the triage vital signs and the nursing notes.  Pertinent labs &  imaging results that were available during my care of the patient were reviewed by me and considered in my medical decision making (see chart for details).    MDM Rules/Calculators/A&P                      Patient's exam is reassuring.  No evidence of peritonsillar abscess.  She does not have any abnormalities on ear exam.  Patient symptoms may be viral but she was given a dose of antibiotics.  I encouraged the patient to continue the over-the-counter medications.  I suspect it will take a little bit longer for her symptoms to resolve. Final Clinical Impression(s) / ED Diagnoses Final diagnoses:  Ear pain, right  Pharyngitis, unspecified etiology    Rx / DC Orders ED Discharge Orders    None       Linwood Dibbles, MD 10/20/19 [phone]

## 2019-10-20 NOTE — ED Triage Notes (Signed)
Pt c/o of right ear pain x 3 days.

## 2019-10-20 NOTE — Discharge Instructions (Addendum)
Continue the over-the-counter medications as needed for pain.  Follow-up with your primary care doctor if your symptoms have not resolved in the next week

## 2019-10-23 ENCOUNTER — Encounter (HOSPITAL_COMMUNITY): Payer: Self-pay | Admitting: Emergency Medicine

## 2019-10-23 ENCOUNTER — Other Ambulatory Visit: Payer: Self-pay

## 2019-10-23 ENCOUNTER — Emergency Department (HOSPITAL_COMMUNITY)
Admission: EM | Admit: 2019-10-23 | Discharge: 2019-10-23 | Disposition: A | Discharge status: Home / Self Care | Payer: Medicaid Other | Attending: Emergency Medicine | Admitting: Emergency Medicine

## 2019-10-23 ENCOUNTER — Emergency Department (HOSPITAL_COMMUNITY): Payer: Medicaid Other

## 2019-10-23 DIAGNOSIS — E119 Type 2 diabetes mellitus without complications: Secondary | ICD-10-CM | POA: Insufficient documentation

## 2019-10-23 DIAGNOSIS — J029 Acute pharyngitis, unspecified: Secondary | ICD-10-CM | POA: Insufficient documentation

## 2019-10-23 DIAGNOSIS — F17200 Nicotine dependence, unspecified, uncomplicated: Secondary | ICD-10-CM | POA: Insufficient documentation

## 2019-10-23 DIAGNOSIS — R0789 Other chest pain: Secondary | ICD-10-CM | POA: Insufficient documentation

## 2019-10-23 DIAGNOSIS — Z7984 Long term (current) use of oral hypoglycemic drugs: Secondary | ICD-10-CM | POA: Insufficient documentation

## 2019-10-23 DIAGNOSIS — J3489 Other specified disorders of nose and nasal sinuses: Secondary | ICD-10-CM | POA: Diagnosis not present

## 2019-10-23 DIAGNOSIS — Z7982 Long term (current) use of aspirin: Secondary | ICD-10-CM | POA: Insufficient documentation

## 2019-10-23 LAB — CBC
HCT: 38 % (ref 36.0–46.0)
Hemoglobin: 12.1 g/dL (ref 12.0–15.0)
MCH: 27.5 pg (ref 26.0–34.0)
MCHC: 31.8 g/dL (ref 30.0–36.0)
MCV: 86.4 fL (ref 80.0–100.0)
Platelets: 313 10*3/uL (ref 150–400)
RBC: 4.4 MIL/uL (ref 3.87–5.11)
RDW: 14.1 % (ref 11.5–15.5)
WBC: 9.4 10*3/uL (ref 4.0–10.5)
nRBC: 0 % (ref 0.0–0.2)

## 2019-10-23 LAB — TROPONIN I (HIGH SENSITIVITY): Troponin I (High Sensitivity): <2 ng/L (ref <18)

## 2019-10-23 LAB — BASIC METABOLIC PANEL
Anion gap: 9 (ref 5–15)
BUN: 17 mg/dL (ref 6–20)
CO2: 24 mmol/L (ref 22–32)
Calcium: 9.2 mg/dL (ref 8.9–10.3)
Chloride: 105 mmol/L (ref 98–111)
Creatinine, Ser: 0.86 mg/dL (ref 0.44–1.00)
GFR calc Af Amer: >60 mL/min (ref >60)
GFR calc non Af Amer: >60 mL/min (ref >60)
Glucose, Bld: 247 mg/dL — ABNORMAL HIGH (ref 70–99)
Potassium: 4 mmol/L (ref 3.5–5.1)
Sodium: 138 mmol/L (ref 135–145)

## 2019-10-23 LAB — MONONUCLEOSIS SCREEN: Mono Screen: NEGATIVE

## 2019-10-23 MED ORDER — AMOXICILLIN-POT CLAVULANATE 875-125 MG PO TABS
1.0000 | ORAL_TABLET | Freq: Once | ORAL | Status: AC
  Administered 2019-10-23: 1 via ORAL
  Filled 2019-10-23: qty 1

## 2019-10-23 MED ORDER — LIDOCAINE VISCOUS HCL 2 % MT SOLN
15.0000 mL | Freq: Once | OROMUCOSAL | Status: AC
  Administered 2019-10-23: 15 mL via OROMUCOSAL
  Filled 2019-10-23: qty 15

## 2019-10-23 MED ORDER — AMOXICILLIN-POT CLAVULANATE 875-125 MG PO TABS: 1.0000 | ORAL_TABLET | Freq: Two times a day (BID) | ORAL | 0 refills | Status: AC

## 2019-10-23 NOTE — ED Notes (Signed)
Pt sitting up in bed. o2 monitor placed. Pt denies any needs. Will continue to monitor.

## 2019-10-23 NOTE — ED Notes (Signed)
Pt sitting up in bed. NAD noted. Pt ready for d/c 

## 2019-10-23 NOTE — ED Triage Notes (Signed)
Per pt, states she has been treated for same symptoms in [place], states meds not working-chest feels heavy, congestion

## 2019-10-23 NOTE — Discharge Instructions (Addendum)
You have been diagnosed today with Sore Throat and atypical chest pain.  At this time there does not appear to be the presence of an emergent medical condition, however there is always the potential for conditions to change. Please read and follow the below instructions.  Please return to the Emergency Department immediately for any new or worsening symptoms. Please be sure to follow up with your Primary Care Provider within one week regarding your visit today; please call their office to schedule an appointment even if you are feeling better for a follow-up visit. Please take your antibiotic Augmentin as prescribed for the next 10 days. Please drink plenty of water and get plenty of rest.  Get help right away if: Your chest pain is worse. You have a cough that gets worse, or you cough up blood. You have very bad (severe) pain in your belly (abdomen). You pass out (faint). You have either of these for no clear reason: Sudden chest discomfort. Sudden discomfort in your arms, back, neck, or jaw. You have shortness of breath at any time. You suddenly start to sweat, or your skin gets clammy. You feel sick to your stomach (nauseous). You throw up (vomit). You suddenly feel lightheaded or dizzy. You feel very weak or tired. Your heart starts to beat fast, or it feels like it is skipping beats. You have trouble breathing. You cannot swallow fluids, soft foods, or your saliva. You have swelling in your throat or neck that gets worse. You keep feeling sick to your stomach (nauseous). You keep throwing up (vomiting). You have any new/concerning or worsening of symptoms  Please read the additional information packets attached to your discharge summary.  Do not take your medicine if  develop an itchy rash, swelling in your mouth or lips, or difficulty breathing; call 911 and seek immediate emergency medical attention if this occurs.  Note: Portions of this text may have been transcribed using  voice recognition software. Every effort was made to ensure accuracy; however, inadvertent computerized transcription errors may still be present.

## 2019-10-23 NOTE — ED Provider Notes (Signed)
[facility]-EMERGENCY DEPT Provider Note   CSN: [id number] Arrival date & time: 10/23/19  1807     History Chief Complaint  Patient presents with  . Shortness of Breath    Destiny Roberts is a 44 y.o. female history diabetes.  Patient presents today for sore throat x1 week, she reports she was seen for this prior, had a negative strep test and was treated with "a shot of penicillin and steroids".  She reports that since discharge she has had persistent sore throat she describes a bilateral moderate intensity scratchy sore sensation nonradiating worsened with swallowing improved with ibuprofen and Tylenol.  She reports that over the past 2-3 days she has also developed heaviness of her chest, she describes it centrally, nonradiating no clear aggravating or alleviating factors.  Denies fever/chills, headache, vision changes, swelling of the face/head/neck, hemoptysis, nausea/vomiting, abdominal pain, numbness/weakness, tingling, extremity swelling/color change, history of blood clot, exogenous hormone use, recent travel/surgeries, history of cancer or any additional concerns. HPI     Past Medical History:  Diagnosis Date  . Diabetes mellitus without complication (HCC)     There are no problems to display for this patient.   Past Surgical History:  Procedure Laterality Date  . CESAREAN SECTION       OB History   No obstetric history on file.     Family History  Problem Relation Age of Onset  . Diabetes Mother   . Diabetes Father     Social History   Tobacco Use  . Smoking status: Current Every Day Smoker    Packs/day: 0.50  . Smokeless tobacco: Never Used  Substance Use Topics  . Alcohol use: Not Currently  . Drug use: Never    Home Medications Prior to Admission medications   Medication Sig Start Date End Date Taking? Authorizing Provider  aspirin (ASPIRIN 81) 81 MG chewable tablet Chew 1 tablet (81 mg total) by mouth daily. 10/06/19  Yes  Avegno, Zachery Dakins, FNP  metFORMIN (GLUCOPHAGE) 850 MG tablet Take 1 tablet (850 mg total) by mouth 2 (two) times daily with a meal. 10/06/19  Yes Avegno, Zachery Dakins, FNP  amoxicillin-clavulanate (AUGMENTIN) 875-125 MG tablet Take 1 tablet by mouth every 12 (twelve) hours for 10 days. 10/23/19 11/02/19  Harlene Salts A, PA-C  ondansetron (ZOFRAN ODT) 4 MG disintegrating tablet Take 1 tablet (4 mg total) by mouth every 8 (eight) hours as needed for nausea or vomiting. Patient not taking: Reported on 10/23/2019 06/27/19   Burgess Amor, PA-C    Allergies    Amoxicillin  Review of Systems   Review of Systems Ten systems are reviewed and are negative for acute change except as noted in the HPI  Physical Exam Updated Vital Signs BP 112/86   Pulse 83   Temp 98.4 F (36.9 C) (Oral)   Resp 17   LMP 10/15/2019 (Approximate)   SpO2 98%   Physical Exam Constitutional:      General: She is not in acute distress.    Appearance: Normal appearance. She is well-developed. She is not ill-appearing or diaphoretic.  HENT:     Head: Normocephalic and atraumatic.     Jaw: There is normal jaw occlusion. No trismus.     Right Ear: Tympanic membrane and external ear normal.     Left Ear: Tympanic membrane and external ear normal.     Nose: Rhinorrhea present. Rhinorrhea is clear.     Right Nostril: No epistaxis.     Left Nostril: No  epistaxis.     Mouth/Throat:     Comments: The patient has normal phonation and is in control of secretions. No stridor.  Midline uvula without edema. Soft palate rises symmetrically.  Mild bilateral tonsillar erythema without exudate, minimal right compared to left swelling. Tongue protrusion is normal, floor of mouth is soft. No trismus. No creptius on neck palpation. No gingival erythema or fluctuance noted. Mucus membranes moist. No pallor noted. Eyes:     General: Vision grossly intact. Gaze aligned appropriately.     Extraocular Movements: Extraocular movements intact.       Conjunctiva/sclera: Conjunctivae normal.     Pupils: Pupils are equal, round, and reactive to light.  Neck:     Trachea: Trachea and phonation normal. No tracheal deviation.  Cardiovascular:     Rate and Rhythm: Normal rate and regular rhythm.     Pulses:          Dorsalis pedis pulses are 2+ on the right side and 2+ on the left side.  Pulmonary:     Effort: Pulmonary effort is normal. No respiratory distress.  Chest:     Chest wall: Tenderness present.  Abdominal:     General: There is no distension.     Palpations: Abdomen is soft.     Tenderness: There is no abdominal tenderness. There is no guarding or rebound.  Musculoskeletal:        General: Normal range of motion.     Cervical back: Full passive range of motion without pain, normal range of motion and neck supple.     Right lower leg: No edema.     Left lower leg: No edema.  Skin:    General: Skin is warm and dry.  Neurological:     Mental Status: She is alert.     GCS: GCS eye subscore is 4. GCS verbal subscore is 5. GCS motor subscore is 6.     Comments: Speech is clear and goal oriented, follows commands Major Cranial nerves without deficit, no facial droop Moves extremities without ataxia, coordination intact  Psychiatric:        Behavior: Behavior normal.     ED Results / Procedures / Treatments   Labs (all labs ordered are listed, but only abnormal results are displayed) Labs Reviewed  BASIC METABOLIC PANEL - Abnormal; Notable for the following components:      Result Value   Glucose, Bld 247 (*)    All other components within normal limits  MONONUCLEOSIS SCREEN  CBC  TROPONIN I (HIGH SENSITIVITY)    EKG EKG Interpretation  Date/Time:  Thursday October 23 2019 18:24:51 EST Ventricular Rate:  91 PR Interval:    QRS Duration: 96 QT Interval:  362 QTC Calculation: 446 R Axis:   45 Text Interpretation: Sinus rhythm 12 Lead; Mason-Likar Confirmed by Kennis Carina [id number]) on 10/23/2019 9:02:51  PM   Radiology DG Chest 2 View  Result Date: 10/23/2019 CLINICAL DATA:  44 year old female with shortness of breath cough and congestion for 2 weeks. Smoker. EXAM: CHEST - 2 VIEW COMPARISON:  None. FINDINGS: Cardiac size at the upper limits of normal. Other mediastinal contours are within normal limits. Visualized tracheal air column is within normal limits. Normal lung volumes. Evidence of mild linear scarring or atelectasis in the middle lobes on the lateral view. But otherwise both lungs appear clear. No pneumothorax or pleural effusion. No acute osseous abnormality identified. Negative visible bowel gas pattern. IMPRESSION: Minor middle lobe scarring or atelectasis. No other acute cardiopulmonary  abnormality. Electronically Signed   By: Odessa Fleming M.D.   On: 10/23/2019 19:07    Procedures Procedures (including critical care time)  Medications Ordered in ED Medications  lidocaine (XYLOCAINE) 2 % viscous mouth solution 15 mL (has no administration in time range)  amoxicillin-clavulanate (AUGMENTIN) 875-125 MG per tablet 1 tablet (has no administration in time range)    ED Course  I have reviewed the triage vital signs and the nursing notes.  Pertinent labs & imaging results that were available during my care of the patient were reviewed by me and considered in my medical decision making (see chart for details).    MDM Rules/Calculators/A&P                     44 year old female history of diabetes otherwise healthy presents today for 1 week of sore throat and 2-3-day history of chest pressure.  On exam she is well-appearing no acute distress, vital signs stable.  Cranial nerves intact, tympanic membranes clear bilaterally, no signs of mastoiditis, mild rhinorrhea without sinus tenderness, mild bilateral tonsillar erythema with swelling right greater than left, uvula is midline, airway patent patient able to swallow without difficulty.  No meningeal signs, heart regular rate and rhythm, lungs  clear abdomen soft nontender, neurovascular tact to all 4 extremities without evidence of DVT.  Vital signs stable.  Chest pain work-up initiated, additionally will add Monospot test.  Patient is low risk by Wells criteria and PERC negative. - CBC within normal limits, no leukocytosis or evidence of anemia.  Monospot test negative.  High-sensitivity troponin negative, in the setting of 2-3 days of symptoms there is no indication for delta troponin.  BMP shows elevation of glucose at 247 otherwise within normal limits no emergent electrolyte derangement, evidence of kidney injury, or evidence of DKA.  CXR:  IMPRESSION:  Minor middle lobe scarring or atelectasis. No other acute  cardiopulmonary abnormality.  No prior chest x-ray to compare, reviewed with Dr. Pilar Plate, doubt this to be infection/infarction at this time.  No tachycardia or hypoxia on room air, afebrile.  EKG: Sinus rhythm 12 Lead; Mason-Likar Confirmed by Kennis Carina [id number]) on 10/23/2019 9:02:51 PM - 9:05 PM: Informed by RN that patient is requesting to leave and that her ride is here. - Overall patient's work-up today is reassuring.  Regarding patient's chest pain for the past 2-3 days her troponin is negative today and there is no indication for delta troponin.  EKG reviewed with Dr. Pilar Plate without ischemic changes.  No evidence of ACS today, additionally patient risk by Wells criteria and PERC negative doubt pulmonary embolism, dissection or other acute cardiopulmonary etiology of her symptoms today.  She has no tachycardia or hypoxia on room air.  On reassessment she is sitting comfortably in bed requesting discharge.  Tolerating p.o. without difficulty.  Chest pain atypical in nature today, no indication for further work-up at this time, she will follow-up with her PCP, she reports that she will call them tomorrow to schedule follow-up appointment.  As the patient's sore throat, she has mild erythema without exudate questionable right  greater than left tonsillar swelling, airway is patent she is able to swallow without difficulty suspicion for PTA at this time.  Additionally no evidence of RPA, Ludwig's, dental abscess or other emergent pathologies at this time.  Plan of care is to treat patient with Augmentin twice daily x10 days and have her follow-up with PCP for recheck.  At this time there does not appear to  be any evidence of an acute emergency medical condition and the patient appears stable for discharge with appropriate outpatient follow up. Diagnosis was discussed with patient who verbalizes understanding of care plan and is agreeable to discharge. I have discussed return precautions with patient who verbalizes understanding of return precautions. Patient encouraged to follow-up with their PCP. All questions answered.  Patient was seen and evaluated by Dr. Sedonia Small during this visit who agrees with discharge with Augmentin and PCP follow-up.  Note: Portions of this report may have been transcribed using voice recognition software. Every effort was made to ensure accuracy; however, inadvertent computerized transcription errors may still be present. Final Clinical Impression(s) / ED Diagnoses Final diagnoses:  Pharyngitis, unspecified etiology  Atypical chest pain    Rx / DC Orders ED Discharge Orders         Ordered    amoxicillin-clavulanate (AUGMENTIN) 875-125 MG tablet  Every 12 hours     10/23/19 2113           Gari Crown 10/23/19 2124    Maudie Flakes, MD 10/24/19 [phone]

## 2019-11-26 ENCOUNTER — Ambulatory Visit: Payer: Medicaid Other | Admitting: Family Medicine

## 2019-12-13 ENCOUNTER — Other Ambulatory Visit: Payer: Self-pay

## 2019-12-13 ENCOUNTER — Ambulatory Visit
Admission: EM | Admit: 2019-12-13 | Discharge: 2019-12-13 | Disposition: A | Discharge status: Home / Self Care | Payer: Medicaid Other | Attending: Emergency Medicine | Admitting: Emergency Medicine

## 2019-12-13 DIAGNOSIS — E119 Type 2 diabetes mellitus without complications: Secondary | ICD-10-CM

## 2019-12-13 DIAGNOSIS — Z760 Encounter for issue of repeat prescription: Secondary | ICD-10-CM | POA: Diagnosis not present

## 2019-12-13 MED ORDER — METFORMIN HCL 850 MG PO TABS: 850.0000 mg | ORAL_TABLET | Freq: Two times a day (BID) | ORAL | 2 refills | Status: DC

## 2019-12-13 NOTE — ED Triage Notes (Signed)
Pt no longer has pcp and needs medication refill for metformin

## 2019-12-13 NOTE — ED Provider Notes (Signed)
[facility]   [id number] 12/13/19 Arrival Time: 1531  CC: Medication refill  SUBJECTIVE:  Destiny Roberts is a 44 y.o. female who presents for medication refill.  Hx of DM x 20 years.  Blood sugar on average 216.  Takes metformin.  Was seeing Dr. Judee Clara, but she is leaving the practice.  Does not have a new PCP.  Has been out of medication for 1 day.  Denies HA, vision changes, dizziness, lightheadedness, chest pain, shortness of breath, numbness or tingling in extremities, abdominal pain, changes in bowel or bladder habits, polydipsia, polyuria.     ROS: As per HPI.  All other pertinent ROS negative.     Past Medical History:  Diagnosis Date  . Diabetes mellitus without complication [facility])    Past Surgical History:  Procedure Laterality Date  . CESAREAN SECTION     Allergies  Allergen Reactions  . Amoxicillin     Yeast Infection   No current facility-administered medications on file prior to encounter.   Current Outpatient Medications on File Prior to Encounter  Medication Sig Dispense Refill  . aspirin (ASPIRIN 81) 81 MG chewable tablet Chew 1 tablet (81 mg total) by mouth daily. 30 tablet 0   Social History   Socioeconomic History  . Marital status: Married    Spouse name: Not on file  . Number of children: Not on file  . Years of education: Not on file  . Highest education level: Not on file  Occupational History  . Not on file  Tobacco Use  . Smoking status: Current Every Day Smoker    Packs/day: 0.50  . Smokeless tobacco: Never Used  Substance and Sexual Activity  . Alcohol use: Not Currently  . Drug use: Never  . Sexual activity: Not on file  Other Topics Concern  . Not on file  Social History Narrative  . Not on file   Social Determinants of Health   Financial Resource Strain:   . Difficulty of Paying Living Expenses:   Food Insecurity:   . Worried About Programme researcher, broadcasting/film/video in the Last Year:   . Barista in the Last Year:     Transportation Needs:   . Freight forwarder (Medical):   Marland Kitchen Lack of Transportation (Non-Medical):   Physical Activity:   . Days of Exercise per Week:   . Minutes of Exercise per Session:   Stress:   . Feeling of Stress :   Social Connections:   . Frequency of Communication with Friends and Family:   . Frequency of Social Gatherings with Friends and Family:   . Attends Religious Services:   . Active Member of Clubs or Organizations:   . Attends Banker Meetings:   Marland Kitchen Marital Status:   Intimate Partner Violence:   . Fear of Current or Ex-Partner:   . Emotionally Abused:   Marland Kitchen Physically Abused:   . Sexually Abused:    Family History  Problem Relation Age of Onset  . Diabetes Mother   . Diabetes Father     OBJECTIVE:  Vitals:   12/13/19 1539  BP: 123/78  Pulse: (!) 105  Resp: 20  Temp: 97.7 F (36.5 C)  SpO2: 96%    General appearance: alert; no distress Eyes: PERRLA; EOMI HENT: normocephalic; atraumatic; oropharynx clear Neck: supple with FROM Lungs: clear to auscultation bilaterally Heart: regular rate and rhythm.   Extremities: no edema; symmetrical with no gross deformities Skin: warm and dry Neurologic: normal gait  Psychological: alert and cooperative; normal mood and affect   ASSESSMENT & PLAN:  1. Medication refill   2. Type 2 diabetes mellitus without complication, without long-term current use of insulin ([place])     Meds ordered this encounter  Medications  . metFORMIN (GLUCOPHAGE) 850 MG tablet    Sig: Take 1 tablet (850 mg total) by mouth 2 (two) times daily with a meal.    Dispense:  30 tablet    Refill:  2    Order Specific Question:   Supervising Provider    Answer:   Raylene Everts [[id number]]    Metformin refilled Please check blood sugar and keep a log PCP information given.   Please follow up with PCP for further evaluation and management of diabetes Return or go to the ER if you experience any new or worsening  symptoms such as: headache, vision changes, dizziness, lightheadedness, chest pain, shortness of breath, numbness or tingling in extremities, abdominal pain, changes in bowel or bladder habits, increased thirst, etc...   Reviewed expectations re: course of current medical issues. Questions answered. Outlined signs and symptoms indicating need for more acute intervention. Patient verbalized understanding. After Visit Summary given.   Lestine Box, PA-C 12/13/19 1546

## 2019-12-13 NOTE — Discharge Instructions (Signed)
Metformin refilled Please check blood sugar and keep a log PCP information given.   Please follow up with PCP for further evaluation and management of diabetes Return or go to the ER if you experience any new or worsening symptoms such as: headache, vision changes, dizziness, lightheadedness, chest pain, shortness of breath, numbness or tingling in extremities, abdominal pain, changes in bowel or bladder habits, increased thirst, etc..Marland Kitchen

## 2019-12-15 ENCOUNTER — Emergency Department (HOSPITAL_COMMUNITY)
Admission: EM | Admit: 2019-12-15 | Discharge: 2019-12-16 | Disposition: A | Discharge status: Home / Self Care | Payer: Medicaid Other | Attending: Emergency Medicine | Admitting: Emergency Medicine

## 2019-12-15 ENCOUNTER — Encounter (HOSPITAL_COMMUNITY): Payer: Self-pay | Admitting: Emergency Medicine

## 2019-12-15 ENCOUNTER — Other Ambulatory Visit: Payer: Self-pay

## 2019-12-15 DIAGNOSIS — F17210 Nicotine dependence, cigarettes, uncomplicated: Secondary | ICD-10-CM | POA: Insufficient documentation

## 2019-12-15 DIAGNOSIS — R739 Hyperglycemia, unspecified: Secondary | ICD-10-CM

## 2019-12-15 DIAGNOSIS — Z7984 Long term (current) use of oral hypoglycemic drugs: Secondary | ICD-10-CM | POA: Insufficient documentation

## 2019-12-15 DIAGNOSIS — R5383 Other fatigue: Secondary | ICD-10-CM | POA: Insufficient documentation

## 2019-12-15 DIAGNOSIS — R531 Weakness: Secondary | ICD-10-CM | POA: Diagnosis not present

## 2019-12-15 DIAGNOSIS — E1165 Type 2 diabetes mellitus with hyperglycemia: Secondary | ICD-10-CM | POA: Diagnosis not present

## 2019-12-15 DIAGNOSIS — Z7982 Long term (current) use of aspirin: Secondary | ICD-10-CM | POA: Insufficient documentation

## 2019-12-15 NOTE — ED Provider Notes (Signed)
[facility] EMERGENCY DEPARTMENT Provider Note   CSN: [id number] Arrival date & time: 12/15/19  2308     History Chief Complaint  Patient presents with  . Weakness    Destiny Roberts is a 44 y.o. female past medical 3 of diabetes who presents for evaluation of generalized weakness and fatigue that has been ongoing for "awhile."  When I asked her to specify this, she says months.  She has not sought evaluation for it.  She states that she had a primary care doctor but they decided to move and she has not had a chance to establish a new primary care doctor.  She states no new or changes in symptoms that brought her to the emergency department but she was just tired of not having an answer so she came to the emergency department.  She does have a history of diabetes and states she was previously on Metformin.  She reports that she has not missed any doses.  She denies any focal weakness.  Patient denies any fevers, chest pain, difficulty breathing, abdominal pain, nausea/vomiting, dysuria, hematuria.  The history is provided by the patient.       Past Medical History:  Diagnosis Date  . Diabetes mellitus without complication (HCC)     There are no problems to display for this patient.   Past Surgical History:  Procedure Laterality Date  . CESAREAN SECTION       OB History   No obstetric history on file.     Family History  Problem Relation Age of Onset  . Diabetes Mother   . Diabetes Father     Social History   Tobacco Use  . Smoking status: Current Every Day Smoker    Packs/day: 0.50  . Smokeless tobacco: Never Used  Substance Use Topics  . Alcohol use: Not Currently  . Drug use: Never    Home Medications Prior to Admission medications   Medication Sig Start Date End Date Taking? Authorizing Provider  aspirin (ASPIRIN 81) 81 MG chewable tablet Chew 1 tablet (81 mg total) by mouth daily. 10/06/19   Avegno, Zachery Dakins, FNP  metFORMIN (GLUCOPHAGE) 850 MG tablet  Take 1 tablet (850 mg total) by mouth 2 (two) times daily with a meal. 12/13/19   Wurst, [place], PA-C    Allergies    Amoxicillin  Review of Systems   Review of Systems  Constitutional: Positive for fatigue. Negative for fever.  Respiratory: Negative for cough and shortness of breath.   Cardiovascular: Negative for chest pain.  Gastrointestinal: Negative for abdominal pain, nausea and vomiting.  Genitourinary: Negative for dysuria and hematuria.  Neurological: Positive for weakness (generalized). Negative for headaches.  All other systems reviewed and are negative.   Physical Exam Updated Vital Signs BP 124/72 (BP Location: Right Arm)   Pulse (!) 107   Temp 98.6 F (37 C) (Oral)   Resp 17   Ht 5' (1.524 m)   Wt 83.9 kg   LMP 12/08/2019   SpO2 100%   BMI 36.13 kg/m   Physical Exam Vitals and nursing note reviewed.  Constitutional:      Appearance: Normal appearance. She is well-developed.  HENT:     Head: Normocephalic and atraumatic.  Eyes:     General: Lids are normal.     Conjunctiva/sclera: Conjunctivae normal.     Pupils: Pupils are equal, round, and reactive to light.  Cardiovascular:     Rate and Rhythm: Normal rate and regular rhythm.  Pulses: Normal pulses.     Heart sounds: Normal heart sounds. No murmur. No friction rub. No gallop.   Pulmonary:     Effort: Pulmonary effort is normal.     Breath sounds: Normal breath sounds.     Comments: Lungs clear to auscultation bilaterally.  Symmetric chest rise.  No wheezing, rales, rhonchi. Abdominal:     Palpations: Abdomen is soft. Abdomen is not rigid.     Tenderness: There is no abdominal tenderness. There is no guarding.     Comments: Abdomen is soft, non-distended, non-tender. No rigidity, No guarding. No peritoneal signs.  Musculoskeletal:        General: Normal range of motion.     Cervical back: Full passive range of motion without pain.  Skin:    General: Skin is warm and dry.     Capillary  Refill: Capillary refill takes less than 2 seconds.  Neurological:     Mental Status: She is alert and oriented to person, place, and time.     Comments: Follows commands, Moves all extremities  5/5 strength to BUE and BLE  Sensation intact throughout all major nerve distributions  Psychiatric:        Speech: Speech normal.     ED Results / Procedures / Treatments   Labs (all labs ordered are listed, but only abnormal results are displayed) Labs Reviewed  COMPREHENSIVE METABOLIC PANEL - Abnormal; Notable for the following components:      Result Value   Glucose, Bld 302 (*)    Calcium 8.7 (*)    Total Protein 6.3 (*)    Albumin 3.1 (*)    AST 14 (*)    All other components within normal limits  URINALYSIS, ROUTINE W REFLEX MICROSCOPIC - Abnormal; Notable for the following components:   APPearance HAZY (*)    Specific Gravity, Urine 1.033 (*)    Glucose, UA >=500 (*)    Protein, ur 30 (*)    Bacteria, UA RARE (*)    All other components within normal limits  CBC WITH DIFFERENTIAL/PLATELET  PREGNANCY, URINE    EKG None  Radiology No results found.  Procedures Procedures (including critical care time)  Medications Ordered in ED Medications  insulin aspart (novoLOG) injection 2 Units (has no administration in time range)    ED Course  I have reviewed the triage vital signs and the nursing notes.  Pertinent labs & imaging results that were available during my care of the patient were reviewed by me and considered in my medical decision making (see chart for details).    MDM Rules/Calculators/A&P                      44 year old female who presents for evaluation of generalized weakness and fatigue that has been ongoing for several months.  Reports that she was tired of not having an answer which is what prompted her emergency department today.  History of diabetes and she states she has been taking her Metformin.  On initial ED arrival, she is afebrile,  nontoxic-appearing.  Vital signs are stable.  Benign abdominal exam.  No neuro deficits noted on exam.  History/physical exam not concerning for infectious etiology.  Doubt DKA.  History/physical exam not concerning for CVA.  Given her history of diabetes, will check basic labs.  CMP shows glucose of 302.  Bicarb is normal.  Anion gap is normal.  Work-up not consistent with DKA.  CBC shows no leukocytosis or anemia.  Urine  pregnancy negative.  UA negative for any infectious etiology.  Discussed results with patient.  At this time, no acute abnormalities.  Discussed case that she will need to establish primary care for further evaluation. At this time, patient exhibits no emergent life-threatening condition that require further evaluation in ED or admission. Patient had ample opportunity for questions and discussion. All patient's questions were answered with full understanding. Strict return precautions discussed. Patient expresses understanding and agreement to plan.   Portions of this note were generated with Lobbyist. Dictation errors may occur despite best attempts at proofreading.  Final Clinical Impression(s) / ED Diagnoses Final diagnoses:  Generalized weakness  Fatigue, unspecified type  Hyperglycemia    Rx / DC Orders ED Discharge Orders    None       Volanda Napoleon, PA-C 12/16/19 0046    Merrily Pew, MD 12/16/19 0998

## 2019-12-15 NOTE — ED Triage Notes (Signed)
Pt C/O generalized weakness and fatigue "all the time." Pt denies N/V and pain.

## 2019-12-16 LAB — CBC WITH DIFFERENTIAL/PLATELET
Abs Immature Granulocytes: 0.02 10*3/uL (ref 0.00–0.07)
Basophils Absolute: 0.1 10*3/uL (ref 0.0–0.1)
Basophils Relative: 1 %
Eosinophils Absolute: 0.2 10*3/uL (ref 0.0–0.5)
Eosinophils Relative: 3 %
HCT: 38.9 % (ref 36.0–46.0)
Hemoglobin: 12.6 g/dL (ref 12.0–15.0)
Immature Granulocytes: 0 %
Lymphocytes Relative: 45 %
Lymphs Abs: 2.7 10*3/uL (ref 0.7–4.0)
MCH: 27.2 pg (ref 26.0–34.0)
MCHC: 32.4 g/dL (ref 30.0–36.0)
MCV: 84 fL (ref 80.0–100.0)
Monocytes Absolute: 0.4 10*3/uL (ref 0.1–1.0)
Monocytes Relative: 7 %
Neutro Abs: 2.7 10*3/uL (ref 1.7–7.7)
Neutrophils Relative %: 44 %
Platelets: 388 10*3/uL (ref 150–400)
RBC: 4.63 MIL/uL (ref 3.87–5.11)
RDW: 13.4 % (ref 11.5–15.5)
WBC: 6.1 10*3/uL (ref 4.0–10.5)
nRBC: 0 % (ref 0.0–0.2)

## 2019-12-16 LAB — COMPREHENSIVE METABOLIC PANEL
ALT: 20 U/L (ref 0–44)
AST: 14 U/L — ABNORMAL LOW (ref 15–41)
Albumin: 3.1 g/dL — ABNORMAL LOW (ref 3.5–5.0)
Alkaline Phosphatase: 81 U/L (ref 38–126)
Anion gap: 9 (ref 5–15)
BUN: 9 mg/dL (ref 6–20)
CO2: 22 mmol/L (ref 22–32)
Calcium: 8.7 mg/dL — ABNORMAL LOW (ref 8.9–10.3)
Chloride: 106 mmol/L (ref 98–111)
Creatinine, Ser: 0.72 mg/dL (ref 0.44–1.00)
GFR calc Af Amer: >60 mL/min (ref >60)
GFR calc non Af Amer: >60 mL/min (ref >60)
Glucose, Bld: 302 mg/dL — ABNORMAL HIGH (ref 70–99)
Potassium: 3.9 mmol/L (ref 3.5–5.1)
Sodium: 137 mmol/L (ref 135–145)
Total Bilirubin: 0.4 mg/dL (ref 0.3–1.2)
Total Protein: 6.3 g/dL — ABNORMAL LOW (ref 6.5–8.1)

## 2019-12-16 LAB — PREGNANCY, URINE: Preg Test, Ur: NEGATIVE

## 2019-12-16 LAB — URINALYSIS, ROUTINE W REFLEX MICROSCOPIC
Bilirubin Urine: NEGATIVE
Glucose, UA: >=500 mg/dL — AB
Hgb urine dipstick: NEGATIVE
Ketones, ur: NEGATIVE mg/dL
Leukocytes,Ua: NEGATIVE
Nitrite: NEGATIVE
Protein, ur: 30 mg/dL — AB
Specific Gravity, Urine: 1.033 — ABNORMAL HIGH (ref 1.005–1.030)
pH: 5 (ref 5.0–8.0)

## 2019-12-16 MED ORDER — INSULIN ASPART 100 UNIT/ML ~~LOC~~ SOLN: 3.0000 [IU] | Freq: Once | SUBCUTANEOUS | Status: DC

## 2019-12-16 MED ORDER — INSULIN ASPART 100 UNIT/ML ~~LOC~~ SOLN
2.0000 [IU] | Freq: Once | SUBCUTANEOUS | Status: AC
  Administered 2019-12-16: 01:00:00 2 [IU] via SUBCUTANEOUS
  Filled 2019-12-16: qty 1

## 2019-12-16 NOTE — Discharge Instructions (Signed)
Make sure you are taking your Metformin.  I provided you some referrals to primary care doctors.  You need to establish with a primary care doctor for your medications and follow-up.  Return the emergency department for any chest pain, difficulty breathing, abdominal pain, vomiting or any other worsening concerning symptoms.

## 2020-01-22 ENCOUNTER — Emergency Department (HOSPITAL_COMMUNITY)
Admission: EM | Admit: 2020-01-22 | Discharge: 2020-01-22 | Discharge status: Against Medical Advice | Payer: Medicaid Other

## 2020-01-22 ENCOUNTER — Other Ambulatory Visit: Payer: Self-pay

## 2020-01-26 ENCOUNTER — Ambulatory Visit
Admission: EM | Admit: 2020-01-26 | Discharge: 2020-01-26 | Disposition: A | Discharge status: Home / Self Care | Payer: Medicaid Other | Attending: Emergency Medicine | Admitting: Emergency Medicine

## 2020-01-26 ENCOUNTER — Emergency Department (HOSPITAL_COMMUNITY)
Admission: EM | Admit: 2020-01-26 | Discharge: 2020-01-26 | Disposition: A | Discharge status: Other Acute Inpatient Hospital | Payer: Medicaid Other

## 2020-01-26 ENCOUNTER — Encounter: Payer: Self-pay | Admitting: Emergency Medicine

## 2020-01-26 ENCOUNTER — Other Ambulatory Visit: Payer: Self-pay

## 2020-01-26 DIAGNOSIS — Z760 Encounter for issue of repeat prescription: Secondary | ICD-10-CM | POA: Insufficient documentation

## 2020-01-26 DIAGNOSIS — H60391 Other infective otitis externa, right ear: Secondary | ICD-10-CM | POA: Insufficient documentation

## 2020-01-26 DIAGNOSIS — N390 Urinary tract infection, site not specified: Secondary | ICD-10-CM | POA: Diagnosis present

## 2020-01-26 LAB — POCT URINALYSIS DIP (MANUAL ENTRY)
Bilirubin, UA: NEGATIVE
Glucose, UA: >=1000 mg/dL — AB
Ketones, POC UA: NEGATIVE mg/dL
Nitrite, UA: POSITIVE — AB
Protein Ur, POC: 100 mg/dL — AB
Spec Grav, UA: 1.025 (ref 1.010–1.025)
Urobilinogen, UA: 0.2 E.U./dL
pH, UA: 5.5 (ref 5.0–8.0)

## 2020-01-26 LAB — POCT FASTING CBG KUC MANUAL ENTRY: POCT Glucose (KUC): 398 mg/dL — AB (ref 70–99)

## 2020-01-26 LAB — POCT URINE PREGNANCY: Preg Test, Ur: NEGATIVE

## 2020-01-26 MED ORDER — CIPROFLOXACIN-DEXAMETHASONE 0.3-0.1 % OT SUSP: 4.0000 [drp] | Freq: Two times a day (BID) | OTIC | 0 refills | Status: AC

## 2020-01-26 MED ORDER — METFORMIN HCL 850 MG PO TABS: 850.0000 mg | ORAL_TABLET | Freq: Two times a day (BID) | ORAL | 1 refills | Status: DC

## 2020-01-26 MED ORDER — NITROFURANTOIN MONOHYD MACRO 100 MG PO CAPS: 100.0000 mg | ORAL_CAPSULE | Freq: Two times a day (BID) | ORAL | 0 refills | Status: DC

## 2020-01-26 MED ORDER — FLUCONAZOLE 150 MG PO TABS: 150.0000 mg | ORAL_TABLET | Freq: Once | ORAL | 0 refills | Status: AC

## 2020-01-26 MED ORDER — FLUTICASONE PROPIONATE 50 MCG/ACT NA SUSP: 1.0000 | Freq: Every day | NASAL | 0 refills | Status: AC

## 2020-01-26 MED ORDER — METFORMIN HCL 850 MG PO TABS: 850.0000 mg | ORAL_TABLET | Freq: Two times a day (BID) | ORAL | 1 refills | Status: AC

## 2020-01-26 NOTE — ED Provider Notes (Signed)
[place]   [id number] 01/26/20 Arrival Time: [place]  Chief Complaint  Patient presents with  . Otalgia     SUBJECTIVE: History from: family.  Destiny Roberts is a 44 y.o. female who presents with of right  ear pain for the past 2  days.  Denies a precipitating event, such as swimming or wearing ear plugs.  Patient states the pain is constant and achy in character.  Patient has tried OTC medication without relief.  Symptoms are made worse with lying down.  Denies similar symptoms in the past.    Denies fever, chills, fatigue, sinus pain, rhinorrhea, ear discharge, sore throat, SOB, wheezing, chest pain, nausea, changes in bowel or bladder habits.    She is also complaining of dysuria for the past 3 days.  She denies a precipitating event or recent sexual encounter.  Her symptoms are made worse with urination.  She reports similar symptoms in the past that improved with antibiotic treatment.  She complains of decreased amount, increased urgency. Denies fever, chills, nausea, vomiting, abdominal pain, flank pain, hematuria, or incontinence  ROS: As per HPI.  All other pertinent ROS negative.     Past Medical History:  Diagnosis Date  . Diabetes mellitus without complication [facility])    Past Surgical History:  Procedure Laterality Date  . CESAREAN SECTION     Allergies  Allergen Reactions  . Amoxicillin     Yeast Infection   No current facility-administered medications on file prior to encounter.   Current Outpatient Medications on File Prior to Encounter  Medication Sig Dispense Refill  . aspirin (ASPIRIN 81) 81 MG chewable tablet Chew 1 tablet (81 mg total) by mouth daily. 30 tablet 0  . metFORMIN (GLUCOPHAGE) 850 MG tablet Take 1 tablet (850 mg total) by mouth 2 (two) times daily with a meal. 30 tablet 2   Social History   Socioeconomic History  . Marital status: Married    Spouse name: Not on file  . Number of children: Not on file  . Years of education: Not on  file  . Highest education level: Not on file  Occupational History  . Not on file  Tobacco Use  . Smoking status: Current Every Day Smoker    Packs/day: 0.50  . Smokeless tobacco: Never Used  Vaping Use  . Vaping Use: Never used  Substance and Sexual Activity  . Alcohol use: Not Currently  . Drug use: Never  . Sexual activity: Not on file  Other Topics Concern  . Not on file  Social History Narrative  . Not on file   Social Determinants of Health   Financial Resource Strain:   . Difficulty of Paying Living Expenses:   Food Insecurity:   . Worried About Charity fundraiser in the Last Year:   . Arboriculturist in the Last Year:   Transportation Needs:   . Film/video editor (Medical):   Marland Kitchen Lack of Transportation (Non-Medical):   Physical Activity:   . Days of Exercise per Week:   . Minutes of Exercise per Session:   Stress:   . Feeling of Stress :   Social Connections:   . Frequency of Communication with Friends and Family:   . Frequency of Social Gatherings with Friends and Family:   . Attends Religious Services:   . Active Member of Clubs or Organizations:   . Attends Archivist Meetings:   Marland Kitchen Marital Status:   Intimate Partner Violence:   .  Fear of Current or Ex-Partner:   . Emotionally Abused:   Marland Kitchen Physically Abused:   . Sexually Abused:    Family History  Problem Relation Age of Onset  . Diabetes Mother   . Diabetes Father     OBJECTIVE:  Vitals:   01/26/20 1455 01/26/20 1457  BP:  109/74  Pulse:  (!) 113  Resp:  17  Temp:  98.8 F (37.1 C)  TempSrc:  Oral  SpO2:  95%  Weight: 190 lb (86.2 kg)   Height: 5' (1.524 m)      Physical Exam Vitals and nursing note reviewed.  Constitutional:      General: She is not in acute distress.    Appearance: Normal appearance. She is normal weight. She is not ill-appearing or toxic-appearing.  HENT:     Head: Normocephalic.     Right Ear: Hearing, ear canal and external ear normal. Tenderness  present. No middle ear effusion. There is no impacted cerumen.     Left Ear: Hearing, ear canal and external ear normal. A middle ear effusion is present. There is no impacted cerumen.     Ears:     Comments: Right ear, pain when pulling tragus Cardiovascular:     Rate and Rhythm: Regular rhythm. Tachycardia present.     Pulses: Normal pulses.     Heart sounds: Normal heart sounds. No murmur heard.  No friction rub. No gallop.   Pulmonary:     Effort: Pulmonary effort is normal. No respiratory distress.     Breath sounds: Normal breath sounds. No stridor. No wheezing, rhonchi or rales.  Chest:     Chest wall: No tenderness.  Abdominal:     General: Abdomen is flat. There is no distension.     Palpations: Abdomen is soft. There is no mass.     Tenderness: There is no abdominal tenderness. There is no right CVA tenderness, left CVA tenderness, guarding or rebound.     Hernia: No hernia is present.  Neurological:     Mental Status: She is alert.    Imaging: No results found.   ASSESSMENT & PLAN:  1. Acute lower UTI   2. Medication refill   3. Infective otitis externa of right ear     Meds ordered this encounter  Medications  . nitrofurantoin, macrocrystal-monohydrate, (MACROBID) 100 MG capsule    Sig: Take 1 capsule (100 mg total) by mouth 2 (two) times daily.    Dispense:  10 capsule    Refill:  0  . ciprofloxacin-dexamethasone (CIPRODEX) OTIC suspension    Sig: Place 4 drops into the right ear 2 (two) times daily.    Dispense:  7.5 mL    Refill:  0  . fluticasone (FLONASE) 50 MCG/ACT nasal spray    Sig: Place 1 spray into both nostrils daily for 14 days.    Dispense:  16 g    Refill:  0  . fluconazole (DIFLUCAN) 150 MG tablet    Sig: Take 1 tablet (150 mg total) by mouth once for 1 dose. Take the second dose 72 hours after the first dose if symptom does not resolve    Dispense:  2 tablet    Refill:  0   Diflucan was prescribed for infection prevention as she states  she developed yeast after completing antibiotic  Discharge instructions Rest and drink plenty of fluids Prescribed macrobid for UTI  Prescribed ciprofloxacin ear drops Prescribed Flonase Take medications as directed and to completion Continue to use  OTC ibuprofen and/ or tylenol as needed for pain control Follow up with PCP if symptoms persists Return here or go to the ER if you have any new or worsening symptoms   Reviewed expectations re: course of current medical issues. Questions answered. Outlined signs and symptoms indicating need for more acute intervention. Patient verbalized understanding. After Visit Summary given.         Durward Parcel, FNP 01/26/20 [phone]

## 2020-01-26 NOTE — ED Triage Notes (Addendum)
Pain and drainage to RT ear and burning with urination and urinary frequency x 2-3 days. Pt also asked for refill on her metformin.

## 2020-01-26 NOTE — Discharge Instructions (Addendum)
Rest and drink plenty of fluids Prescribed macrobid for UTI  Prescribed ciprofloxacin ear drops Prescribed Flonase Take medications as directed and to completion Continue to use OTC ibuprofen and/ or tylenol as needed for pain control Follow up with PCP if symptoms persists Return here or go to the ER if you have any new or worsening symptoms

## 2020-01-28 LAB — URINE CULTURE: Culture: >=100000 — AB

## 2020-02-02 ENCOUNTER — Other Ambulatory Visit: Payer: Self-pay

## 2020-02-02 ENCOUNTER — Encounter (HOSPITAL_COMMUNITY): Payer: Self-pay | Admitting: *Deleted

## 2020-02-02 ENCOUNTER — Emergency Department (HOSPITAL_COMMUNITY)
Admission: EM | Admit: 2020-02-02 | Discharge: 2020-02-02 | Disposition: A | Discharge status: Home / Self Care | Payer: Medicaid Other | Attending: Emergency Medicine | Admitting: Emergency Medicine

## 2020-02-02 DIAGNOSIS — H02846 Edema of left eye, unspecified eyelid: Secondary | ICD-10-CM | POA: Diagnosis present

## 2020-02-02 DIAGNOSIS — Z5321 Procedure and treatment not carried out due to patient leaving prior to being seen by health care provider: Secondary | ICD-10-CM | POA: Diagnosis not present

## 2020-02-02 NOTE — ED Triage Notes (Signed)
Left eye swelling and painful onset 3 days ago

## 2020-02-03 ENCOUNTER — Other Ambulatory Visit: Payer: Self-pay

## 2020-02-03 ENCOUNTER — Ambulatory Visit
Admission: EM | Admit: 2020-02-03 | Discharge: 2020-02-03 | Disposition: A | Discharge status: Home / Self Care | Payer: Medicaid Other | Attending: Emergency Medicine | Admitting: Emergency Medicine

## 2020-02-03 DIAGNOSIS — H00014 Hordeolum externum left upper eyelid: Secondary | ICD-10-CM

## 2020-02-03 DIAGNOSIS — H02846 Edema of left eye, unspecified eyelid: Secondary | ICD-10-CM | POA: Diagnosis not present

## 2020-02-03 MED ORDER — POLYMYXIN B-TRIMETHOPRIM 10000-0.1 UNIT/ML-% OP SOLN: 1.0000 [drp] | Freq: Four times a day (QID) | OPHTHALMIC | 0 refills | Status: AC

## 2020-02-03 MED ORDER — ERYTHROMYCIN 5 MG/GM OP OINT: TOPICAL_OINTMENT | OPHTHALMIC | 0 refills | Status: AC

## 2020-02-03 NOTE — Discharge Instructions (Signed)
Continue warm compresses at home.  Soak a wash cloth in warm (not scalding) water and place it over the eyes. As the wash cloth cools, it should be rewarmed and replaced for a total of 5 to 10 minutes of soaking time. Warm compresses should be applied two to four times a day as long as the patient has symptoms Perform lid washing: Either warm water or very dilute baby shampoo can be placed on a clean wash cloth, gauze pad, or cotton swab. Then be advised to gently clean along the lashes and lid margin to remove the accumulated material with care to avoid contacting the ocular surface. If shampoo is used, thorough rinsing is recommended. Vigorous washing should be avoided, as it may cause more irritation.  Prescribed erythromycin ointment.  Apply up to 6 times daily for 5-7 days, or until symptomatic improvement Follow up with ophthalmology for further evaluation and management if symptoms persists Return or go to ER if you have any new or worsening symptoms such as fever, chills, redness, swelling, eye pain, painful eye movements, vision changes, etc... 

## 2020-02-03 NOTE — ED Triage Notes (Signed)
Pt has left swollen eye from stye for past 4 days

## 2020-02-03 NOTE — ED Provider Notes (Signed)
[place]   [id number] 02/03/20 Arrival Time: 3419  CC: LT eyelid pain and swelling  SUBJECTIVE:  Destiny Roberts is a 44 y.o. female who presents with complaint of eye lid swelling and pain that began 4 days ago.  Denies a precipitating event, trauma, or close contacts with similar symptoms.  Has tried warm compresses without relief.  Symptoms are made worse to the touch.  Denies similar symptoms in the past.  Complains of discharge from eyelid.  Denies fever, chills, nausea, vomiting, eye pain, painful eye movements, halos, itching, vision changes, double vision, FB sensation, periorbital erythema.     Denies contact lens use.    ROS: As per HPI.  All other pertinent ROS negative.     Past Medical History:  Diagnosis Date  . Diabetes mellitus without complication [facility])    Past Surgical History:  Procedure Laterality Date  . CESAREAN SECTION     Allergies  Allergen Reactions  . Amoxicillin     Yeast Infection   No current facility-administered medications on file prior to encounter.   Current Outpatient Medications on File Prior to Encounter  Medication Sig Dispense Refill  . aspirin (ASPIRIN 81) 81 MG chewable tablet Chew 1 tablet (81 mg total) by mouth daily. 30 tablet 0  . ciprofloxacin-dexamethasone (CIPRODEX) OTIC suspension Place 4 drops into the right ear 2 (two) times daily. 7.5 mL 0  . fluticasone (FLONASE) 50 MCG/ACT nasal spray Place 1 spray into both nostrils daily for 14 days. 16 g 0  . metFORMIN (GLUCOPHAGE) 850 MG tablet Take 1 tablet (850 mg total) by mouth 2 (two) times daily with a meal. 60 tablet 1   Social History   Socioeconomic History  . Marital status: Married    Spouse name: Not on file  . Number of children: Not on file  . Years of education: Not on file  . Highest education level: Not on file  Occupational History  . Not on file  Tobacco Use  . Smoking status: Current Every Day Smoker    Packs/day: 0.50  . Smokeless tobacco:  Never Used  Vaping Use  . Vaping Use: Never used  Substance and Sexual Activity  . Alcohol use: Not Currently  . Drug use: Never  . Sexual activity: Not on file  Other Topics Concern  . Not on file  Social History Narrative  . Not on file   Social Determinants of Health   Financial Resource Strain:   . Difficulty of Paying Living Expenses:   Food Insecurity:   . Worried About Charity fundraiser in the Last Year:   . Arboriculturist in the Last Year:   Transportation Needs:   . Film/video editor (Medical):   Marland Kitchen Lack of Transportation (Non-Medical):   Physical Activity:   . Days of Exercise per Week:   . Minutes of Exercise per Session:   Stress:   . Feeling of Stress :   Social Connections:   . Frequency of Communication with Friends and Family:   . Frequency of Social Gatherings with Friends and Family:   . Attends Religious Services:   . Active Member of Clubs or Organizations:   . Attends Archivist Meetings:   Marland Kitchen Marital Status:   Intimate Partner Violence:   . Fear of Current or Ex-Partner:   . Emotionally Abused:   Marland Kitchen Physically Abused:   . Sexually Abused:    Family History  Problem Relation Age of Onset  .  Diabetes Mother   . Diabetes Father     OBJECTIVE:   Vitals:   02/03/20 1433  BP: 106/65  Pulse: 98  Resp: 18  Temp: 98.6 F (37 C)  SpO2: 96%    General appearance: alert; no distress Eyes: LT upper eyelid with stye to corner of eye with purulent drainage, no conjunctival erythema. PERRL; EOMI without discomfort Neck: supple Lungs: clear to auscultation bilaterally Heart: regular rate and rhythm Skin: warm and dry Psychological: alert and cooperative; normal mood and affect  ASSESSMENT & PLAN:  1. Swelling of eyelid, left   2. Hordeolum externum of left upper eyelid     Meds ordered this encounter  Medications  . erythromycin ophthalmic ointment    Sig: Place a 1/2 inch ribbon of ointment over eyelid.    Dispense:  3.5  g    Refill:  0    Order Specific Question:   Supervising Provider    Answer:   Eustace Moore [[id number]]  . trimethoprim-polymyxin b (POLYTRIM) ophthalmic solution    Sig: Place 1 drop into the left eye 4 (four) times daily for 7 days.    Dispense:  10 mL    Refill:  0    Order Specific Question:   Supervising Provider    Answer:   Eustace Moore [[id number]]    Continue warm compresses at home.  Soak a wash cloth in warm (not scalding) water and place it over the eyes. As the wash cloth cools, it should be rewarmed and replaced for a total of 5 to 10 minutes of soaking time. Warm compresses should be applied two to four times a day as long as the patient has symptoms Perform lid washing: Either warm water or very dilute baby shampoo can be placed on a clean wash cloth, gauze pad, or cotton swab. Then be advised to gently clean along the lashes and lid margin to remove the accumulated material with care to avoid contacting the ocular surface. If shampoo is used, thorough rinsing is recommended. Vigorous washing should be avoided, as it may cause more irritation.  Prescribed erythromycin ointment.  Apply up to 6 times daily for 5-7 days, or until symptomatic improvement Follow up with ophthalmology for further evaluation and management if symptoms persists Return or go to ER if you have any new or worsening symptoms such as fever, chills, redness, swelling, eye pain, painful eye movements, vision changes, etc...  Reviewed expectations re: course of current medical issues. Questions answered. Outlined signs and symptoms indicating need for more acute intervention. Patient verbalized understanding. After Visit Summary given.   Rennis Harding, PA-C 02/03/20 1503

## 2020-02-05 ENCOUNTER — Encounter (HOSPITAL_COMMUNITY): Payer: Self-pay | Admitting: Emergency Medicine

## 2020-02-05 ENCOUNTER — Emergency Department (HOSPITAL_COMMUNITY)
Admission: EM | Admit: 2020-02-05 | Discharge: 2020-02-05 | Disposition: A | Discharge status: Home / Self Care | Payer: Medicaid Other | Attending: Emergency Medicine | Admitting: Emergency Medicine

## 2020-02-05 ENCOUNTER — Other Ambulatory Visit: Payer: Self-pay

## 2020-02-05 ENCOUNTER — Encounter (HOSPITAL_COMMUNITY): Payer: Self-pay

## 2020-02-05 ENCOUNTER — Emergency Department (HOSPITAL_COMMUNITY): Payer: Medicaid Other

## 2020-02-05 DIAGNOSIS — Z7984 Long term (current) use of oral hypoglycemic drugs: Secondary | ICD-10-CM | POA: Insufficient documentation

## 2020-02-05 DIAGNOSIS — Z5321 Procedure and treatment not carried out due to patient leaving prior to being seen by health care provider: Secondary | ICD-10-CM | POA: Insufficient documentation

## 2020-02-05 DIAGNOSIS — E119 Type 2 diabetes mellitus without complications: Secondary | ICD-10-CM | POA: Diagnosis not present

## 2020-02-05 DIAGNOSIS — H00036 Abscess of eyelid left eye, unspecified eyelid: Secondary | ICD-10-CM | POA: Diagnosis not present

## 2020-02-05 DIAGNOSIS — F17210 Nicotine dependence, cigarettes, uncomplicated: Secondary | ICD-10-CM | POA: Diagnosis not present

## 2020-02-05 DIAGNOSIS — H5712 Ocular pain, left eye: Secondary | ICD-10-CM | POA: Diagnosis present

## 2020-02-05 DIAGNOSIS — H5789 Other specified disorders of eye and adnexa: Secondary | ICD-10-CM | POA: Diagnosis present

## 2020-02-05 DIAGNOSIS — R519 Headache, unspecified: Secondary | ICD-10-CM | POA: Insufficient documentation

## 2020-02-05 DIAGNOSIS — Z79899 Other long term (current) drug therapy: Secondary | ICD-10-CM | POA: Diagnosis not present

## 2020-02-05 DIAGNOSIS — L03213 Periorbital cellulitis: Secondary | ICD-10-CM | POA: Insufficient documentation

## 2020-02-05 DIAGNOSIS — Z880 Allergy status to penicillin: Secondary | ICD-10-CM | POA: Diagnosis not present

## 2020-02-05 LAB — BASIC METABOLIC PANEL
Anion gap: 9 (ref 5–15)
BUN: 12 mg/dL (ref 6–20)
CO2: 22 mmol/L (ref 22–32)
Calcium: 8.6 mg/dL — ABNORMAL LOW (ref 8.9–10.3)
Chloride: 106 mmol/L (ref 98–111)
Creatinine, Ser: 0.66 mg/dL (ref 0.44–1.00)
GFR calc Af Amer: >60 mL/min (ref >60)
GFR calc non Af Amer: >60 mL/min (ref >60)
Glucose, Bld: 244 mg/dL — ABNORMAL HIGH (ref 70–99)
Potassium: 4.2 mmol/L (ref 3.5–5.1)
Sodium: 137 mmol/L (ref 135–145)

## 2020-02-05 LAB — CBC WITH DIFFERENTIAL/PLATELET
Abs Immature Granulocytes: 0.02 10*3/uL (ref 0.00–0.07)
Basophils Absolute: 0.1 10*3/uL (ref 0.0–0.1)
Basophils Relative: 1 %
Eosinophils Absolute: 0.1 10*3/uL (ref 0.0–0.5)
Eosinophils Relative: 2 %
HCT: 36.6 % (ref 36.0–46.0)
Hemoglobin: 11.7 g/dL — ABNORMAL LOW (ref 12.0–15.0)
Immature Granulocytes: 0 %
Lymphocytes Relative: 34 %
Lymphs Abs: 2.6 10*3/uL (ref 0.7–4.0)
MCH: 27.5 pg (ref 26.0–34.0)
MCHC: 32 g/dL (ref 30.0–36.0)
MCV: 86.1 fL (ref 80.0–100.0)
Monocytes Absolute: 0.4 10*3/uL (ref 0.1–1.0)
Monocytes Relative: 5 %
Neutro Abs: 4.5 10*3/uL (ref 1.7–7.7)
Neutrophils Relative %: 58 %
Platelets: 313 10*3/uL (ref 150–400)
RBC: 4.25 MIL/uL (ref 3.87–5.11)
RDW: 14.9 % (ref 11.5–15.5)
WBC: 7.7 10*3/uL (ref 4.0–10.5)
nRBC: 0 % (ref 0.0–0.2)

## 2020-02-05 LAB — I-STAT BETA HCG BLOOD, ED (MC, WL, AP ONLY): I-stat hCG, quantitative: <5 m[IU]/mL (ref <5)

## 2020-02-05 MED ORDER — TETRACAINE HCL 0.5 % OP SOLN
2.0000 [drp] | Freq: Once | OPHTHALMIC | Status: AC
  Administered 2020-02-05: 2 [drp] via OPHTHALMIC
  Filled 2020-02-05: qty 4

## 2020-02-05 MED ORDER — HYDROCODONE-ACETAMINOPHEN 5-325 MG PO TABS
1.0000 | ORAL_TABLET | Freq: Once | ORAL | Status: AC
  Administered 2020-02-05: 1 via ORAL
  Filled 2020-02-05: qty 1

## 2020-02-05 MED ORDER — FLUORESCEIN SODIUM 1 MG OP STRP
1.0000 | ORAL_STRIP | Freq: Once | OPHTHALMIC | Status: AC
  Administered 2020-02-05: 1 via OPHTHALMIC
  Filled 2020-02-05: qty 1

## 2020-02-05 MED ORDER — CEPHALEXIN 500 MG PO CAPS
500.0000 mg | ORAL_CAPSULE | Freq: Once | ORAL | Status: AC
  Administered 2020-02-05: 500 mg via ORAL
  Filled 2020-02-05: qty 1

## 2020-02-05 MED ORDER — HYDROCODONE-ACETAMINOPHEN 5-325 MG PO TABS: 1.0000 | ORAL_TABLET | Freq: Four times a day (QID) | ORAL | 0 refills | Status: DC | PRN

## 2020-02-05 MED ORDER — CEPHALEXIN 500 MG PO CAPS
500.0000 mg | ORAL_CAPSULE | Freq: Three times a day (TID) | ORAL | 0 refills | Status: AC
Start: 2020-02-05 — End: 2020-02-10

## 2020-02-05 MED ORDER — IOHEXOL 300 MG/ML  SOLN
75.0000 mL | Freq: Once | INTRAMUSCULAR | Status: AC | PRN
  Administered 2020-02-05: 75 mL via INTRAVENOUS

## 2020-02-05 MED ORDER — SODIUM CHLORIDE (PF) 0.9 % IJ SOLN
INTRAMUSCULAR | Status: AC
  Filled 2020-02-05: qty 50

## 2020-02-05 MED ORDER — FLUCONAZOLE 150 MG PO TABS
150.0000 mg | ORAL_TABLET | Freq: Once | ORAL | Status: AC
  Administered 2020-02-05: 150 mg via ORAL
  Filled 2020-02-05: qty 1

## 2020-02-05 NOTE — ED Triage Notes (Signed)
Pt c/o left eye pain, swelling and drainage since last Thursday. reports went to [facility] and got medications for the eye but not helping.

## 2020-02-05 NOTE — ED Provider Notes (Signed)
[facility]-EMERGENCY DEPT Provider Note   CSN: [id number] Arrival date & time: 02/05/20  1029    History Chief Complaint  Patient presents with  . Eye Pain    Destiny Roberts is a 44 y.o. female with no significant past medical history who presents for evaluation of eye drainage and swelling. Seen by UC 2 dasy ago and given Abx for eye lid infection. Patient states not improved. Has not followed with Opthalmology outpatient. Feel blurred vision to eye which changes when blinks. No diplopia. Pain to surrounding eye and pain with movement. No fever chills, N/V, HA, weakness, dizziness.  Does admit to yellow discharge to medial canthus to left eye.  Has been using erythromycin eyedrops.  Rates current pain a 6/10.  Denies additional aggravating or relieving factors.  History obtained from patient and past medical records.  No interpreter used.    HPI     Past Medical History:  Diagnosis Date  . Diabetes mellitus without complication (HCC)     There are no problems to display for this patient.   Past Surgical History:  Procedure Laterality Date  . CESAREAN SECTION       OB History   No obstetric history on file.     Family History  Problem Relation Age of Onset  . Diabetes Mother   . Diabetes Father     Social History   Tobacco Use  . Smoking status: Current Every Day Smoker    Packs/day: 0.50  . Smokeless tobacco: Never Used  Vaping Use  . Vaping Use: Never used  Substance Use Topics  . Alcohol use: Not Currently  . Drug use: Never    Home Medications Prior to Admission medications   Medication Sig Start Date End Date Taking? Authorizing Provider  aspirin (ASPIRIN 81) 81 MG chewable tablet Chew 1 tablet (81 mg total) by mouth daily. 10/06/19   Avegno, Zachery Dakins, FNP  cephALEXin (KEFLEX) 500 MG capsule Take 1 capsule (500 mg total) by mouth 3 (three) times daily for 5 days. 02/05/20 02/10/20  Adeyemi Hamad A, PA-C    ciprofloxacin-dexamethasone (CIPRODEX) OTIC suspension Place 4 drops into the right ear 2 (two) times daily. 01/26/20   Avegno, Zachery Dakins, FNP  erythromycin ophthalmic ointment Place a 1/2 inch ribbon of ointment over eyelid. 02/03/20   Wurst, [place], PA-C  fluticasone (FLONASE) 50 MCG/ACT nasal spray Place 1 spray into both nostrils daily for 14 days. 01/26/20 02/09/20  Avegno, Zachery Dakins, FNP  HYDROcodone-acetaminophen (NORCO/VICODIN) 5-325 MG tablet Take 1 tablet by mouth every 6 (six) hours as needed. 02/05/20   Rishaan Gunner A, PA-C  metFORMIN (GLUCOPHAGE) 850 MG tablet Take 1 tablet (850 mg total) by mouth 2 (two) times daily with a meal. 01/26/20   Avegno, Zachery Dakins, FNP  trimethoprim-polymyxin b (POLYTRIM) ophthalmic solution Place 1 drop into the left eye 4 (four) times daily for 7 days. 02/03/20 02/10/20  Rennis Harding, PA-C    Allergies    Amoxicillin  Review of Systems   Review of Systems  Constitutional: Negative.   HENT: Positive for facial swelling.        Periorbital edema  Eyes: Positive for pain and discharge. Negative for photophobia, redness and itching.  Respiratory: Negative.   Cardiovascular: Negative.   Gastrointestinal: Negative.   Genitourinary: Negative.   Musculoskeletal: Negative.   Skin: Negative.   Neurological: Negative.   All other systems reviewed and are negative.   Physical Exam Updated Vital Signs BP (!) 127/98 (  BP Location: Left Arm)   Pulse 78   Temp 98.4 F (36.9 C) (Oral)   Resp 16   LMP 02/02/2020 (Approximate)   SpO2 100%   Physical Exam Vitals and nursing note reviewed.  Constitutional:      General: She is not in acute distress.    Appearance: She is well-developed. She is not ill-appearing, toxic-appearing or diaphoretic.  HENT:     Head: Normocephalic and atraumatic.     Nose: Nose normal.     Mouth/Throat:     Mouth: Mucous membranes are moist.  Eyes:     General: Lids are everted, no foreign bodies appreciated. Vision  grossly intact. No visual field deficit or scleral icterus.    Intraocular pressure: Right eye pressure is 14 mmHg. Left eye pressure is 15 mmHg.     Extraocular Movements: Extraocular movements intact.     Conjunctiva/sclera: Conjunctivae normal.     Pupils: Pupils are equal, round, and reactive to light.     Slit lamp exam:    Right eye: Anterior chamber quiet.     Left eye: Anterior chamber quiet.     Visual Fields: Right eye visual fields normal and left eye visual fields normal.     Comments: No conjunctival injection.  EOMs intact.  Edema and erythema to periorbital area to left eye.  She has some drainage to her superior medial canthus.. Mild pain with eye movement. No fluorescein uptake, negative seidel sign. No corneal ulcerations, abrasion.  Cardiovascular:     Rate and Rhythm: Normal rate.     Pulses: Normal pulses.     Heart sounds: Normal heart sounds.  Pulmonary:     Effort: Pulmonary effort is normal. No respiratory distress.     Breath sounds: Normal breath sounds.  Abdominal:     General: Bowel sounds are normal. There is no distension.  Musculoskeletal:        General: Normal range of motion.     Cervical back: Normal range of motion.  Skin:    General: Skin is warm and dry.     Capillary Refill: Capillary refill takes less than 2 seconds.  Neurological:     General: No focal deficit present.     Mental Status: She is alert and oriented to person, place, and time.     Comments: CN 2-12 grossly intact. No facial droop.     ED Results / Procedures / Treatments   Labs (all labs ordered are listed, but only abnormal results are displayed) Labs Reviewed  CBC WITH DIFFERENTIAL/PLATELET - Abnormal; Notable for the following components:      Result Value   Hemoglobin 11.7 (*)    All other components within normal limits  BASIC METABOLIC PANEL - Abnormal; Notable for the following components:   Glucose, Bld 244 (*)    Calcium 8.6 (*)    All other components within  normal limits  I-STAT BETA HCG BLOOD, ED ([facility], [facility], [facility] ONLY)    EKG None  Radiology CT Orbits W Contrast  Result Date: 02/05/2020 CLINICAL DATA:  Mass, lump or swelling, max face. Additional history provided: Patient reports 1 week of left eye redness, drainage with pain around face. EXAM: CT ORBITS WITH CONTRAST TECHNIQUE: Multidetector CT images was performed according to the standard protocol following intravenous contrast administration. CONTRAST:  56mL OMNIPAQUE IOHEXOL 300 MG/ML  SOLN COMPARISON:  No pertinent prior studies available for comparison. FINDINGS: Orbits: There is left periorbital soft tissue swelling likely reflecting left periorbital cellulitis.  There is a centrally hypodense and peripherally enhancing focus along the inferomedial aspect of the left globe measuring 6 mm suspicious for preseptal abscess (series 2, image 25). There is no definite CT evidence of postseptal extension of infection on the current examination. The globes are normal in size and contour. The extraocular muscles are symmetric. Visualized sinuses: No significant paranasal sinus disease or mastoid effusion. Soft tissues: Left periorbital soft tissue swelling as described. The visualized maxillofacial and upper neck soft tissues are otherwise unremarkable. Limited intracranial: No acute intracranial abnormality identified. IMPRESSION: Left periorbital soft tissue swelling likely reflecting periorbital cellulitis. Suspected 6 mm preseptal abscess along the inferomedial aspect of the left globe as described. No definite CT evidence of post-septal extension of infection on the current examination. Electronically Signed   By: Jackey Loge DO   On: 02/05/2020 15:16    Procedures Procedures (including critical care time)  Medications Ordered in ED Medications  sodium chloride (PF) 0.9 % injection (has no administration in time range)  tetracaine (PONTOCAINE) 0.5 % ophthalmic solution 2 drop (2 drops Left Eye Given  by Other 02/05/20 1235)  fluorescein ophthalmic strip 1 strip (1 strip Left Eye Given 02/05/20 1235)  iohexol (OMNIPAQUE) 300 MG/ML solution 75 mL (75 mLs Intravenous Contrast Given 02/05/20 1456)  HYDROcodone-acetaminophen (NORCO/VICODIN) 5-325 MG per tablet 1 tablet (1 tablet Oral Given 02/05/20 1524)  cephALEXin (KEFLEX) capsule 500 mg (500 mg Oral Given 02/05/20 1633)  fluconazole (DIFLUCAN) tablet 150 mg (150 mg Oral Given 02/05/20 1633)   ED Course  I have reviewed the triage vital signs and the nursing notes.  Pertinent labs & imaging results that were available during my care of the patient were reviewed by me and considered in my medical decision making (see chart for details).  3 old female presents for evaluation of eyelid swelling and drainage.  Seen by urgent care started erythromycin ointment.  She is afebrile, nonseptic, non-ill-appearing.  Does have erythema and edema to her left periorbital area.  Subjective pain with eye movement.  Does have some purulent drainage to her superior medial canthus.  IOP left 15, right 14.  No evidence of conjunctival injection, hemorrhage or chemosis.  No cells and flare on exam.  EOMs intact.  She does have some visual discrepancy in her left eye however this clears with blinking.  Labs, imaging personally reviewed and interpreted: CBC without leukocytosis, hemoglobin 11.7 Metabolic panel with mild hyperglycemia to 244 however normal anion gap, patient has history of diabetes.  Blood sugar consistent with prior evaluations Pregnancy test negative CT orbits with contrast showed periorbital cellulitis to left as well as likely 6 mm preseptal abscess.  They do not see any orbital cellulitis or post septal extension.  Consult with Dr. Cathey Endow with ophthalmology.  He has personally reviewed imaging and recommends Keflex, 3 times daily and he will follow-up in office tomorrow morning.  First dose given here in the ED. If patient has sudden, severe worsening  symptoms she needs to turn to the emergency department for reimaging.  CT imaging. The patient has been appropriately medically screened and/or stabilized in the ED. I have low suspicion for any other emergent medical condition which would require further screening, evaluation or treatment in the ED or require inpatient management.  Patient is hemodynamically stable and in no acute distress.  Patient able to ambulate in department prior to ED.  Evaluation does not show acute pathology that would require ongoing or additional emergent interventions while in the emergency department  or further inpatient treatment.  I have discussed the diagnosis with the patient and answered all questions.  Pain is been managed while in the emergency department and patient has no further complaints prior to discharge.  Patient is comfortable with plan discussed in room and is stable for discharge at this time.  I have discussed strict return precautions for returning to the emergency department.  Patient was encouraged to follow-up with PCP/specialist refer to at discharge.      MDM Rules/Calculators/A&P                           Final Clinical Impression(s) / ED Diagnoses Final diagnoses:  Preseptal cellulitis of left eye  Eyelid abscess, left    Rx / DC Orders ED Discharge Orders         Ordered    cephALEXin (KEFLEX) 500 MG capsule  3 times daily     Discontinue  Reprint     02/05/20 1618    HYDROcodone-acetaminophen (NORCO/VICODIN) 5-325 MG tablet  Every 6 hours PRN     Discontinue  Reprint     02/05/20 1618           Kamilah Correia A, PA-C 02/05/20 1748    Little, Ambrose Finland, MD 02/06/20 [phone]

## 2020-02-05 NOTE — ED Triage Notes (Signed)
Patient complains of 1 week of left eye redness, drainage with pain around face. Patient seen at [facility] and using drops with no relief

## 2020-02-05 NOTE — ED Notes (Signed)
Called for pt x3 with no response fr vital sign recheck

## 2020-02-05 NOTE — Discharge Instructions (Signed)
You have an appointment with the ophthalmologist at 815 tomorrow morning.  The address is listed in your discharge paperwork.  This appointment is with Dr. Orson Slick.  You are given your first dose of antibiotics here in the emergency department.  Take the medication as prescribed.  If you notice severe, worsening pain, severe, sudden decrease in vision seek reevaluation emergency department

## 2020-02-07 ENCOUNTER — Other Ambulatory Visit: Payer: Self-pay

## 2020-02-07 ENCOUNTER — Encounter (HOSPITAL_COMMUNITY): Payer: Self-pay

## 2020-02-07 ENCOUNTER — Emergency Department (HOSPITAL_COMMUNITY)
Admission: EM | Admit: 2020-02-07 | Discharge: 2020-02-07 | Disposition: A | Discharge status: Home / Self Care | Payer: Medicaid Other | Attending: Emergency Medicine | Admitting: Emergency Medicine

## 2020-02-07 DIAGNOSIS — L0291 Cutaneous abscess, unspecified: Secondary | ICD-10-CM | POA: Diagnosis not present

## 2020-02-07 DIAGNOSIS — E119 Type 2 diabetes mellitus without complications: Secondary | ICD-10-CM | POA: Diagnosis not present

## 2020-02-07 DIAGNOSIS — Z7982 Long term (current) use of aspirin: Secondary | ICD-10-CM | POA: Insufficient documentation

## 2020-02-07 DIAGNOSIS — Z79899 Other long term (current) drug therapy: Secondary | ICD-10-CM | POA: Diagnosis not present

## 2020-02-07 DIAGNOSIS — F17210 Nicotine dependence, cigarettes, uncomplicated: Secondary | ICD-10-CM | POA: Insufficient documentation

## 2020-02-07 DIAGNOSIS — H5712 Ocular pain, left eye: Secondary | ICD-10-CM

## 2020-02-07 DIAGNOSIS — L03213 Periorbital cellulitis: Secondary | ICD-10-CM | POA: Diagnosis not present

## 2020-02-07 LAB — CBG MONITORING, ED: Glucose-Capillary: 162 mg/dL — ABNORMAL HIGH (ref 70–99)

## 2020-02-07 MED ORDER — HYDROCODONE-ACETAMINOPHEN 5-325 MG PO TABS: 1.0000 | ORAL_TABLET | Freq: Four times a day (QID) | ORAL | 0 refills | Status: AC | PRN

## 2020-02-07 MED ORDER — HYDROCODONE-ACETAMINOPHEN 5-325 MG PO TABS
1.0000 | ORAL_TABLET | Freq: Once | ORAL | Status: AC
  Administered 2020-02-07: 1 via ORAL
  Filled 2020-02-07: qty 1

## 2020-02-07 NOTE — ED Provider Notes (Addendum)
[facility] EMERGENCY DEPARTMENT Provider Note   CSN: [id number] Arrival date & time: 02/07/20  0532   Time seen 6:05 AM  History Chief Complaint  Patient presents with  . Eye Pain    Destiny Roberts is a 44 y.o. female.  HPI   Patient was seen on June 22 at urgent care for eyelid swelling and pain of her left eye.  At that time she was started on erythromycin ointment and Polytrim ophthalmic drops, she was advised to use warm compresses.  She was seen 2 days later on June 24 in the ED at [facility].  She had extensive work-up done at that time.  She had lab work, CT of her orbits with contrast which showed periorbital cellulitis to the left eye as well as a likely 6 mm preseptal abscess.  They did not see any orbital cellulitis or post septal extension.  She was discussed with Dr. Cathey Endow, ophthalmology and he recommended they start her on Keflex 3 times a day and he would see her in the office the following day.  Patient states she saw Dr. Cathey Endow in the office yesterday, June 25.  She said he did not change her medication.  She is to be reseen on the 28th and at some point he is going to do surgery to drain her abscess.  She states she was given 5 pain pills when she was seen at the last ED visit which she only had to left when she saw the ophthalmologist yesterday but she has since ran out.  She now wants more.  She states her eye has not changed, it is not worse.  She denies any fever.  She states she took [place] powders without relief and she last took Motrin about 10 PM.  PCP Patient, No Pcp Per   Past Medical History:  Diagnosis Date  . Diabetes mellitus without complication (HCC)     There are no problems to display for this patient.   Past Surgical History:  Procedure Laterality Date  . CESAREAN SECTION       OB History   No obstetric history on file.     Family History  Problem Relation Age of Onset  . Diabetes Mother   . Diabetes Father     Social History    Tobacco Use  . Smoking status: Current Every Day Smoker    Packs/day: 0.50  . Smokeless tobacco: Never Used  Vaping Use  . Vaping Use: Never used  Substance Use Topics  . Alcohol use: Not Currently  . Drug use: Never    Home Medications Prior to Admission medications   Medication Sig Start Date End Date Taking? Authorizing Provider  aspirin (ASPIRIN 81) 81 MG chewable tablet Chew 1 tablet (81 mg total) by mouth daily. 10/06/19  Yes Avegno, Zachery Dakins, FNP  cephALEXin (KEFLEX) 500 MG capsule Take 1 capsule (500 mg total) by mouth 3 (three) times daily for 5 days. 02/05/20 02/10/20 Yes Henderly, Britni A, PA-C  erythromycin ophthalmic ointment Place a 1/2 inch ribbon of ointment over eyelid. 02/03/20  Yes Wurst, [place], PA-C  metFORMIN (GLUCOPHAGE) 850 MG tablet Take 1 tablet (850 mg total) by mouth 2 (two) times daily with a meal. 01/26/20  Yes Avegno, Zachery Dakins, FNP  trimethoprim-polymyxin b (POLYTRIM) ophthalmic solution Place 1 drop into the left eye 4 (four) times daily for 7 days. 02/03/20 02/10/20 Yes Wurst, [place], PA-C  ciprofloxacin-dexamethasone (CIPRODEX) OTIC suspension Place 4 drops into the right ear 2 (  two) times daily. 01/26/20   Avegno, Zachery Dakins, FNP  fluticasone (FLONASE) 50 MCG/ACT nasal spray Place 1 spray into both nostrils daily for 14 days. 01/26/20 02/09/20  Avegno, Zachery Dakins, FNP  HYDROcodone-acetaminophen (NORCO/VICODIN) 5-325 MG tablet Take 1 tablet by mouth every 6 (six) hours as needed. 02/07/20   Devoria Albe, MD    Allergies    Amoxicillin  Review of Systems   Review of Systems  All other systems reviewed and are negative.   Physical Exam Updated Vital Signs BP 117/88 (BP Location: Right Arm)   Pulse 92   Temp 98.4 F (36.9 C)   Resp 18   Ht 5' (1.524 m)   Wt 81.6 kg   LMP 02/02/2020 (Approximate)   SpO2 100%   BMI 35.15 kg/m   Physical Exam Vitals reviewed.  Constitutional:      Appearance: Normal appearance. She is obese.  HENT:      Head: Normocephalic and atraumatic.  Eyes:     Comments: Patient is noted to have conjunctival injection especially of the medial side of the left eyeball.  She has good range of motion although she states it is uncomfortable.  She is noted to have some swelling of the upper and lower eyelid with some redness.  There does appear to be some swelling of the inner medial lid margin of the upper eyelid.  She does not appear to have bulging of the eyeball.  Cardiovascular:     Rate and Rhythm: Normal rate and regular rhythm.     Pulses: Normal pulses.     Heart sounds: No murmur heard.   Pulmonary:     Effort: Pulmonary effort is normal. No respiratory distress.     Breath sounds: Normal breath sounds.  Musculoskeletal:     Cervical back: Normal range of motion and neck supple.  Neurological:     General: No focal deficit present.     Mental Status: She is alert and oriented to person, place, and time.     Cranial Nerves: No cranial nerve deficit.  Psychiatric:        Mood and Affect: Mood normal.        Behavior: Behavior normal.        Thought Content: Thought content normal.       ED Results / Procedures / Treatments   Labs (all labs ordered are listed, but only abnormal results are displayed) Labs Reviewed  CBG MONITORING, ED - Abnormal; Notable for the following components:      Result Value   Glucose-Capillary 162 (*)    All other components within normal limits    EKG None  Radiology CT Orbits W Contrast  Result Date: 02/05/2020 CLINICAL DATA:  Mass, lump or swelling, max face. Additional history provided: Patient reports 1 week of left eye redness, drainage with pain around face. EXAM: CT ORBITS WITH CONTRAST TECHNIQUE: Multidetector CT images was performed according to the standard protocol following intravenous contrast administration. CONTRAST:  25mL OMNIPAQUE IOHEXOL 300 MG/ML  SOLN COMPARISON:  No pertinent prior studies available for comparison. FINDINGS: Orbits:  There is left periorbital soft tissue swelling likely reflecting left periorbital cellulitis. There is a centrally hypodense and peripherally enhancing focus along the inferomedial aspect of the left globe measuring 6 mm suspicious for preseptal abscess (series 2, image 25). There is no definite CT evidence of postseptal extension of infection on the current examination. The globes are normal in size and contour. The extraocular muscles are symmetric. Visualized sinuses:  No significant paranasal sinus disease or mastoid effusion. Soft tissues: Left periorbital soft tissue swelling as described. The visualized maxillofacial and upper neck soft tissues are otherwise unremarkable. Limited intracranial: No acute intracranial abnormality identified. IMPRESSION: Left periorbital soft tissue swelling likely reflecting periorbital cellulitis. Suspected 6 mm preseptal abscess along the inferomedial aspect of the left globe as described. No definite CT evidence of post-septal extension of infection on the current examination. Electronically Signed   By: Kellie Simmering DO   On: 02/05/2020 15:16    Procedures Procedures (including critical care time)  Medications Ordered in ED Medications  HYDROcodone-acetaminophen (NORCO/VICODIN) 5-325 MG per tablet 1 tablet (has no administration in time range)    ED Course  I have reviewed the triage vital signs and the nursing notes.  Pertinent labs & imaging results that were available during my care of the patient were reviewed by me and considered in my medical decision making (see chart for details).    MDM Rules/Calculators/A&P                          Review of the [place] shows patient has only gotten #5 hydrocodone from her ED visit on June 24.  She had a couple of narcotic prescriptions from 2019 in [place].  Patient is taking her polymyxin B trimethoprim drops, erythromycin ointment, oral Keflex as previously prescribed.  She was given 1  hydrocodone in the ED and given 6 hydrocodone to go home.  I have explained to her that if she has any more problems she needs to contact her ophthalmologist for more pain medication.  She should be rechecked if she has worsening symptoms such as fever, increasing pain.  She currently tells me that nothing is new it is the same as it was.    Visual Acuity  Right Eye Distance: 20/50 Left Eye Distance: 20/100 Bilateral Distance: 20/55  Right Eye Near:   Left Eye Near:    Bilateral Near:      Final Clinical Impression(s) / ED Diagnoses Final diagnoses:  Pain of left eye  Preseptal cellulitis of left eye  Abscess    Rx / DC Orders ED Discharge Orders         Ordered    HYDROcodone-acetaminophen (NORCO/VICODIN) 5-325 MG tablet  Every 6 hours PRN     Discontinue  Reprint     02/07/20 0629         Plan discharge  Rolland Porter, MD, Barbette Or, MD 02/07/20 [place], [place], MD 02/07/20 [phone]

## 2020-02-07 NOTE — ED Notes (Signed)
ED Provider at bedside. 

## 2020-02-07 NOTE — Discharge Instructions (Signed)
Continue your medications. Continue using heat on your eyelids. Take the pain medication as prescribed. If you get a fever, worsening pain or swelling, you need to call the ophthalmologist on call for Dr Cathey Endow, call the office number and speak to the answering service. Keep your appointment on the 28th.

## 2020-02-07 NOTE — ED Triage Notes (Signed)
Pt states she is seeing ophthalmologist in [place] for an abscess in her left eye.  She is supposed to have a procedure on Monday but states they did not give her enough pain pills to last until then.  Pt c/o left eye pain 10/10.

## 2020-03-01 ENCOUNTER — Ambulatory Visit (HOSPITAL_COMMUNITY)
Admission: EM | Admit: 2020-03-01 | Discharge: 2020-03-01 | Disposition: A | Discharge status: Home / Self Care | Payer: Medicaid Other

## 2020-03-01 ENCOUNTER — Encounter (HOSPITAL_COMMUNITY): Payer: Self-pay

## 2020-03-01 ENCOUNTER — Other Ambulatory Visit: Payer: Self-pay

## 2020-03-01 NOTE — Progress Notes (Signed)
Patient denies SI/HI/Psychosis/Substasnce Abuse.   Patient requests to have her medication management appointment moved to an earlier time slot.  Writer coordinated with the outpatient front desk.  Unfortunately, there have been no cancellations and the appt could not be moved to an earlier time slot.   Patient has a scheduled appointment oin 04-07-2020.

## 2020-03-30 ENCOUNTER — Encounter: Payer: Self-pay | Admitting: Family Medicine

## 2020-03-30 ENCOUNTER — Ambulatory Visit: Payer: Medicaid Other | Admitting: Family Medicine

## 2020-03-30 NOTE — Progress Notes (Signed)
Patient did not keep appointment today. She may call to reschedule.  

## 2020-04-07 ENCOUNTER — Ambulatory Visit (HOSPITAL_COMMUNITY): Payer: Self-pay | Admitting: Psychiatry

## 2020-04-23 ENCOUNTER — Encounter: Payer: Self-pay | Admitting: Emergency Medicine

## 2020-04-23 ENCOUNTER — Ambulatory Visit
Admission: EM | Admit: 2020-04-23 | Discharge: 2020-04-23 | Disposition: A | Discharge status: Home / Self Care | Payer: Medicaid Other | Attending: Emergency Medicine | Admitting: Emergency Medicine

## 2020-04-23 ENCOUNTER — Other Ambulatory Visit: Payer: Self-pay

## 2020-04-23 DIAGNOSIS — Z113 Encounter for screening for infections with a predominantly sexual mode of transmission: Secondary | ICD-10-CM | POA: Diagnosis not present

## 2020-04-23 DIAGNOSIS — A599 Trichomoniasis, unspecified: Secondary | ICD-10-CM

## 2020-04-23 MED ORDER — METRONIDAZOLE 500 MG PO TABS: 500.0000 mg | ORAL_TABLET | Freq: Once | ORAL | Status: DC

## 2020-04-23 MED ORDER — METRONIDAZOLE 500 MG PO TABS
2000.0000 mg | ORAL_TABLET | Freq: Once | ORAL | Status: AC
  Administered 2020-04-23: 2000 mg via ORAL

## 2020-04-23 MED ORDER — TINIDAZOLE 500 MG PO TABS: 2.0000 g | ORAL_TABLET | Freq: Every day | ORAL | 0 refills | Status: AC

## 2020-04-23 NOTE — ED Triage Notes (Signed)
Patient has some tingling feeling and vaginal odor, clear thick discharge- didnt take medication correctly first time. Does not want to be tested again but does want treatment. partner tested positive in July. LMP- last week

## 2020-04-23 NOTE — ED Provider Notes (Signed)
[facility]   [id number] 04/23/20 Arrival Time: 1642   [id number] DISCHARGE  SUBJECTIVE:  Destiny Roberts is a 44 y.o. female who presented to the urgent care with a complaint of vaginal discharge for the past few days.  She denies a precipitating event, recent sexual encounter or recent antibiotic use.  States she tested positive for trichomonas 2 days ago has been treated with metronidazole twice daily 7 days.  States symptom has not resolved because she was drinking.  Patient is sexually active with 1 female partner.  Describes discharge as brown. She reports similar symptoms in the past   She denies fever, chills, nausea, vomiting, abdominal or pelvic pain, urinary symptoms, vaginal itching, vaginal odor, vaginal bleeding, dyspareunia, vaginal rashes or lesions.   No LMP recorded. Current birth control method: Compliant with BC:  ROS: As per HPI.  All other pertinent ROS negative.     Past Medical History:  Diagnosis Date  . Diabetes mellitus without complication [facility])    Past Surgical History:  Procedure Laterality Date  . CESAREAN SECTION     Allergies  Allergen Reactions  . Amoxicillin     Yeast Infection   No current facility-administered medications on file prior to encounter.   Current Outpatient Medications on File Prior to Encounter  Medication Sig Dispense Refill  . aspirin (ASPIRIN 81) 81 MG chewable tablet Chew 1 tablet (81 mg total) by mouth daily. 30 tablet 0  . ciprofloxacin-dexamethasone (CIPRODEX) OTIC suspension Place 4 drops into the right ear 2 (two) times daily. 7.5 mL 0  . erythromycin ophthalmic ointment Place a 1/2 inch ribbon of ointment over eyelid. 3.5 g 0  . fluticasone (FLONASE) 50 MCG/ACT nasal spray Place 1 spray into both nostrils daily for 14 days. 16 g 0  . HYDROcodone-acetaminophen (NORCO/VICODIN) 5-325 MG tablet Take 1 tablet by mouth every 6 (six) hours as needed. 6 tablet 0  . metFORMIN (GLUCOPHAGE) 850 MG tablet Take 1 tablet  (850 mg total) by mouth 2 (two) times daily with a meal. 60 tablet 1    Social History   Socioeconomic History  . Marital status: Married    Spouse name: Not on file  . Number of children: Not on file  . Years of education: Not on file  . Highest education level: Not on file  Occupational History  . Not on file  Tobacco Use  . Smoking status: Current Every Day Smoker    Packs/day: 0.50  . Smokeless tobacco: Never Used  Vaping Use  . Vaping Use: Never used  Substance and Sexual Activity  . Alcohol use: Not Currently  . Drug use: Never  . Sexual activity: Not on file  Other Topics Concern  . Not on file  Social History Narrative  . Not on file   Social Determinants of Health   Financial Resource Strain:   . Difficulty of Paying Living Expenses: Not on file  Food Insecurity:   . Worried About Programme researcher, broadcasting/film/video in the Last Year: Not on file  . Ran Out of Food in the Last Year: Not on file  Transportation Needs:   . Lack of Transportation (Medical): Not on file  . Lack of Transportation (Non-Medical): Not on file  Physical Activity:   . Days of Exercise per Week: Not on file  . Minutes of Exercise per Session: Not on file  Stress:   . Feeling of Stress : Not on file  Social Connections:   . Frequency of Communication  with Friends and Family: Not on file  . Frequency of Social Gatherings with Friends and Family: Not on file  . Attends Religious Services: Not on file  . Active Member of Clubs or Organizations: Not on file  . Attends Banker Meetings: Not on file  . Marital Status: Not on file  Intimate Partner Violence:   . Fear of Current or Ex-Partner: Not on file  . Emotionally Abused: Not on file  . Physically Abused: Not on file  . Sexually Abused: Not on file   Family History  Problem Relation Age of Onset  . Diabetes Mother   . Diabetes Father     OBJECTIVE:  Vitals:   04/23/20 1703  BP: 123/90  Pulse: 95  Resp: 16  Temp: 98.3 F  (36.8 C)  SpO2: 97%     General appearance: Alert, NAD, appears stated age Head: NCAT Throat: lips, mucosa, and tongue normal; teeth and gums normal Lungs: CTA bilaterally without adventitious breath sounds Heart: regular rate and rhythm.  Radial pulses 2+ symmetrical bilaterally Back: no CVA tenderness Abdomen: soft, non-tender; bowel sounds normal; no masses or organomegaly; no guarding or rebound tenderness GU:  Cervical self swab obtained Skin: warm and dry Psychological:  Alert and cooperative. Normal mood and affect.  LABS:  Results for orders placed or performed during the hospital encounter of 02/07/20  CBG monitoring, ED  Result Value Ref Range   Glucose-Capillary 162 (H) 70 - 99 mg/dL   Comment 1 Notify RN    Comment 2 Document in Chart     Labs Reviewed  CERVICOVAGINAL ANCILLARY ONLY    ASSESSMENT & PLAN:  1. Screening for STD (sexually transmitted disease)   2. Trichomonas infection     Meds ordered this encounter  Medications  . tinidazole (TINDAMAX) 500 MG tablet    Sig: Take 4 tablets (2,000 mg total) by mouth daily with breakfast.    Dispense:  1 tablet    Refill:  0    Pending: Labs Reviewed  CERVICOVAGINAL ANCILLARY ONLY     Discharge instructions  Vaginal self-swab obtained.  We will follow up with you regarding abnormal results Prescribed tinadazole 2000 mg  single dose take medications as prescribed and to completion If tests results are positive, please abstain from sexual activity until you and your partner(s) have been treated Follow up with PCP or [facility] if symptoms persists Return here or go to ER if you have any new or worsening symptoms fever, chills, nausea, vomiting, abdominal or pelvic pain, painful intercourse, vaginal discharge, vaginal bleeding, persistent symptoms despite treatment, etc...  Reviewed expectations re: course of current medical issues. Questions answered. Outlined signs and symptoms indicating need  for more acute intervention. Patient verbalized understanding. After Visit Summary given.    Note: This document was prepared using Dragon voice recognition software and may include unintentional dictation errors.    Durward Parcel, FNP 04/23/20 1757

## 2020-04-23 NOTE — Discharge Instructions (Signed)
Vaginal self-swab obtained.  We will follow up with you regarding abnormal results Prescribed tinadazole 2000 mg  single dose take medications as prescribed and to completion If tests results are positive, please abstain from sexual activity until you and your partner(s) have been treated Follow up with PCP or [facility] if symptoms persists Return here or go to ER if you have any new or worsening symptoms fever, chills, nausea, vomiting, abdominal or pelvic pain, painful intercourse, vaginal discharge, vaginal bleeding, persistent symptoms despite treatment, etc..Marland Kitchen

## 2020-04-26 LAB — CERVICOVAGINAL ANCILLARY ONLY
Bacterial Vaginitis (gardnerella): POSITIVE — AB
Candida Glabrata: POSITIVE — AB
Candida Vaginitis: POSITIVE — AB
Chlamydia: NEGATIVE
Comment: NEGATIVE
Comment: NEGATIVE
Comment: NEGATIVE
Comment: NEGATIVE
Comment: NEGATIVE
Comment: NORMAL
Neisseria Gonorrhea: NEGATIVE
Trichomonas: NEGATIVE

## 2020-04-28 ENCOUNTER — Telehealth (HOSPITAL_COMMUNITY): Payer: Self-pay | Admitting: Emergency Medicine

## 2020-04-28 MED ORDER — TERCONAZOLE 0.4 % VA CREA: 1.0000 | TOPICAL_CREAM | Freq: Every day | VAGINAL | 0 refills | Status: AC

## 2022-01-14 IMAGING — CT CT ORBITS W/ CM
3 series · 14 of 47 positions shown, 16 images · IV contrast (OMNIPAQUE 300)
Comparison: No pertinent prior studies available for comparison.

CLINICAL DATA: Mass, lump or swelling, max face. Additional history
provided: Patient reports 1 week of left eye redness, drainage with
pain around face.

EXAM:
CT ORBITS WITH CONTRAST
TECHNIQUE: Multidetector CT images was performed according to the standard
protocol following intravenous contrast administration.
CONTRAST:  75mL OMNIPAQUE IOHEXOL 300 MG/ML  SOLN

[Series 2: orbits 2.0 st · axial · 0.29mm/px · z∈[+1535,+1613]mm · 8 of 47 slices shown, 10 images]
[im 4/47  brain]
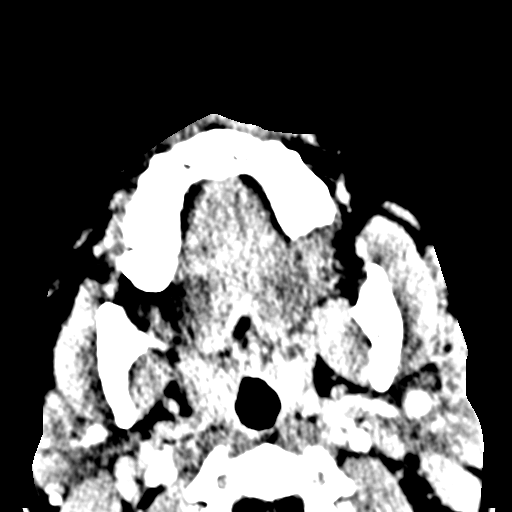
[im 4/47  bone]
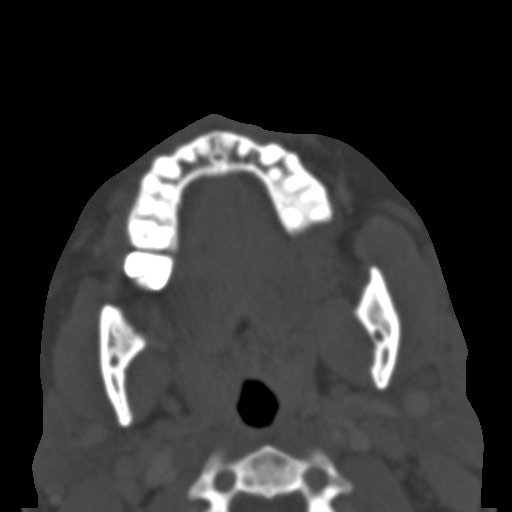
[im 10/47  bone]
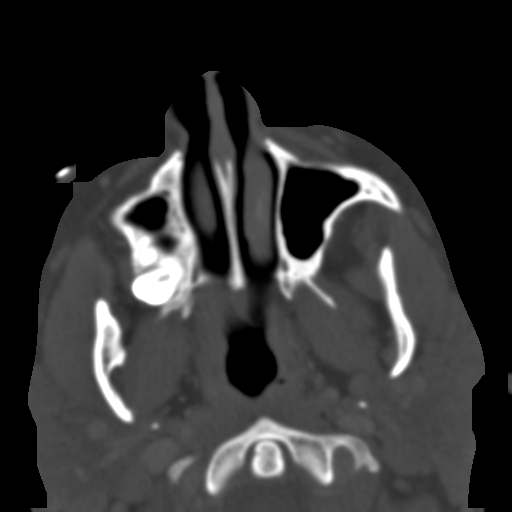
[im 15/47  bone]
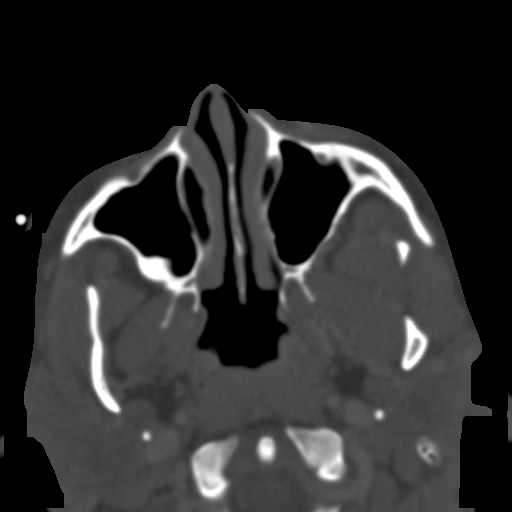
[im 21/47  bone]
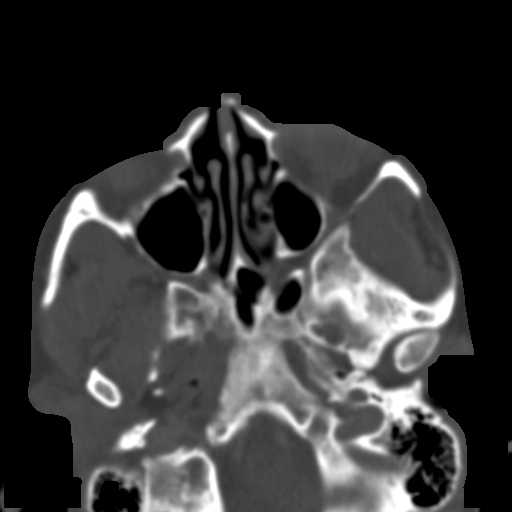
[im 26/47  brain]
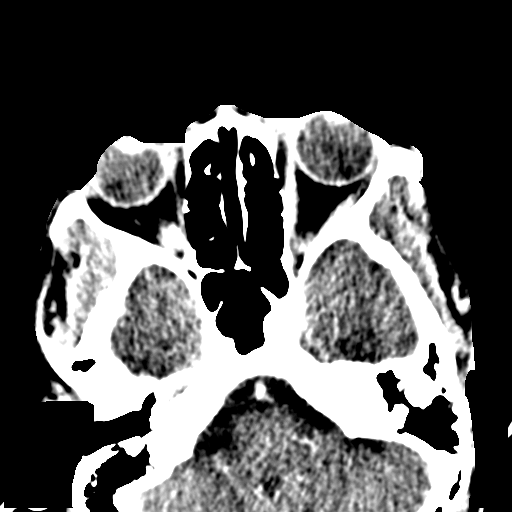
[im 26/47  bone]
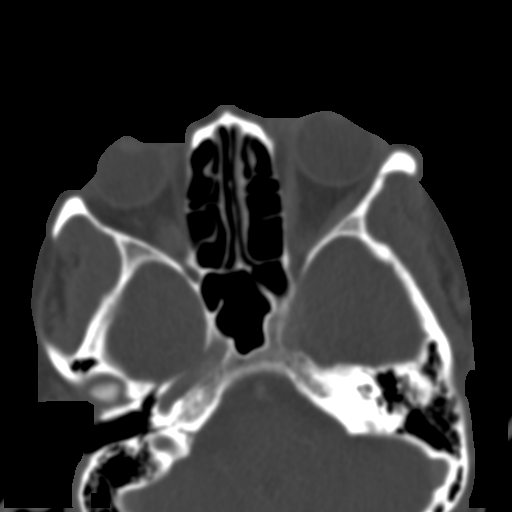
[im 32/47  bone]
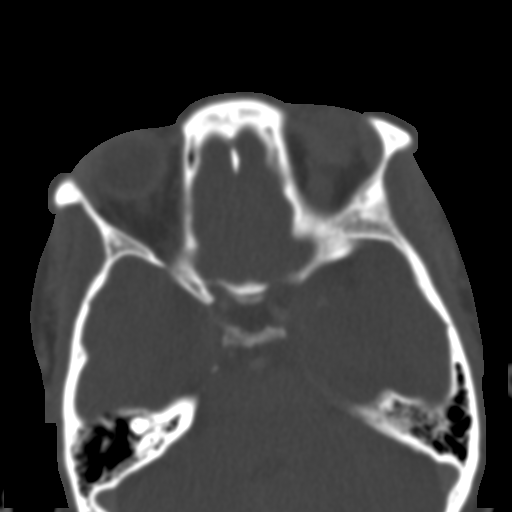
[im 37/47  bone]
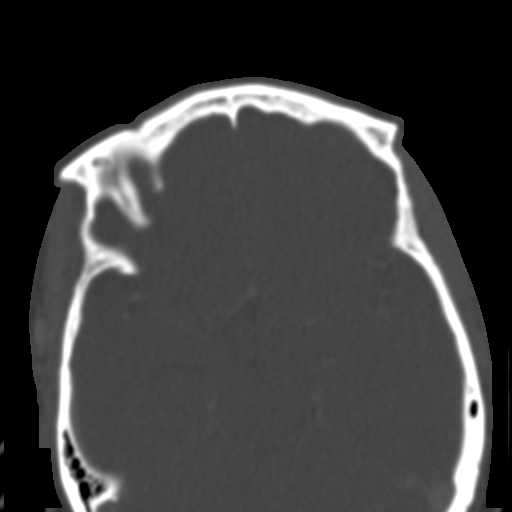
[im 43/47  bone]
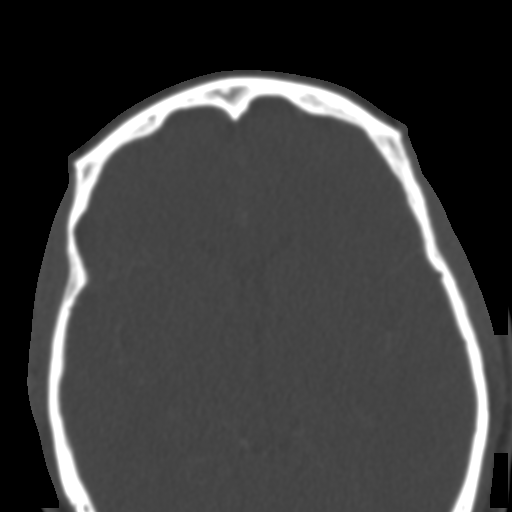

[Series 4: orbits coronal st · coronal · 0.23mm/px · 3 of 82 slices shown]
[im 28/82  bone]
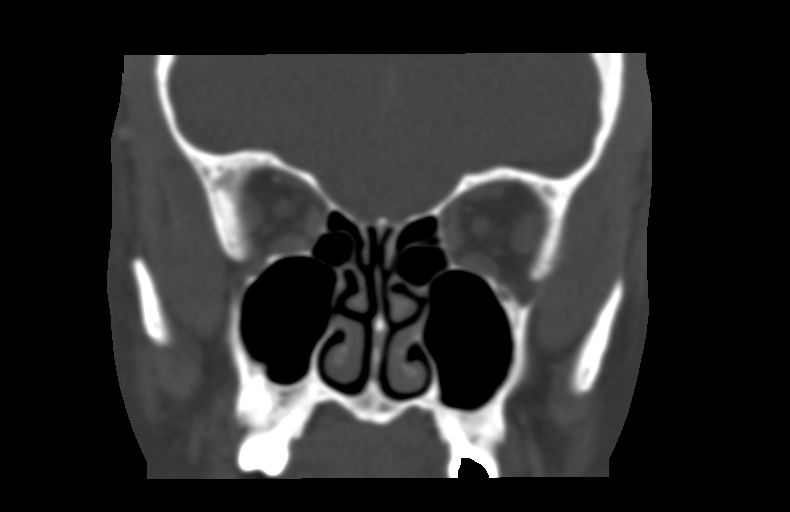
[im 37/82  bone]
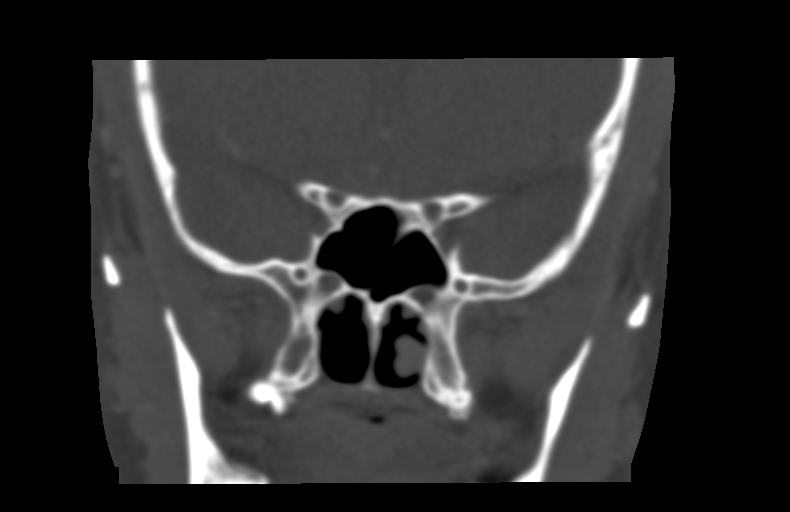
[im 46/82  bone]
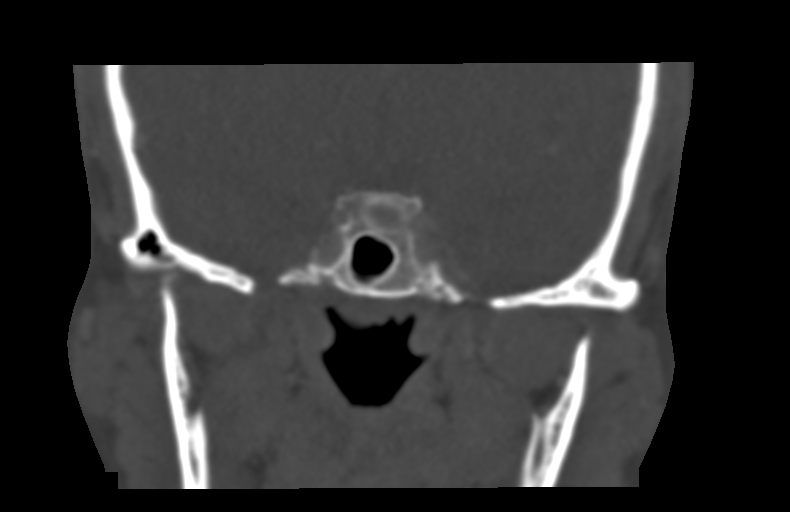

[Series 5: orbits sagittal st · sagittal · 0.21mm/px · 3 of 74 slices shown]
[im 25/74  bone]
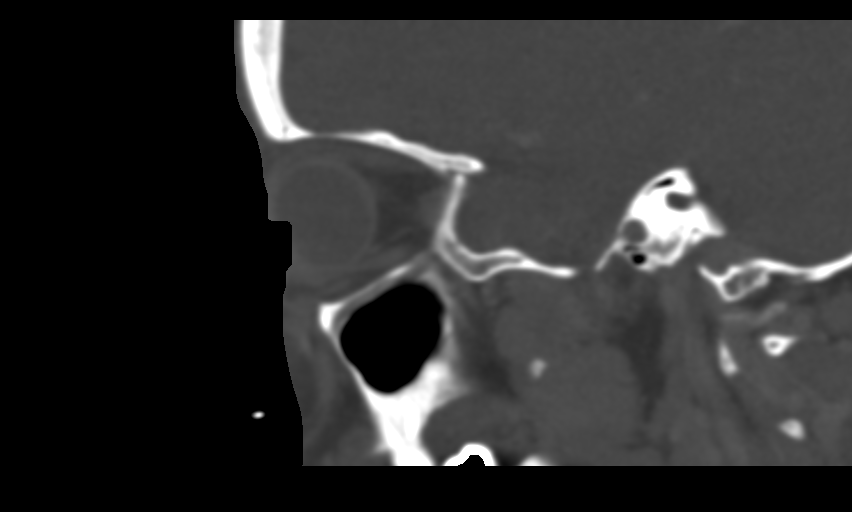
[im 37/74  bone]
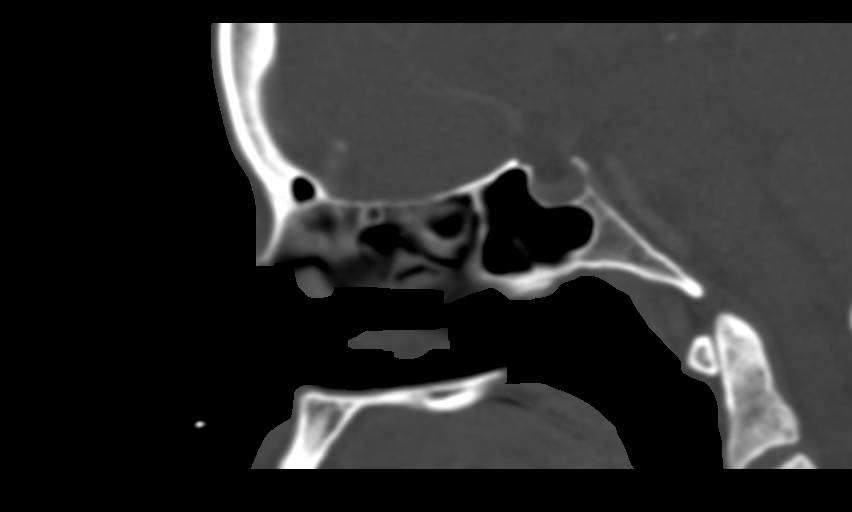
[im 49/74  bone]
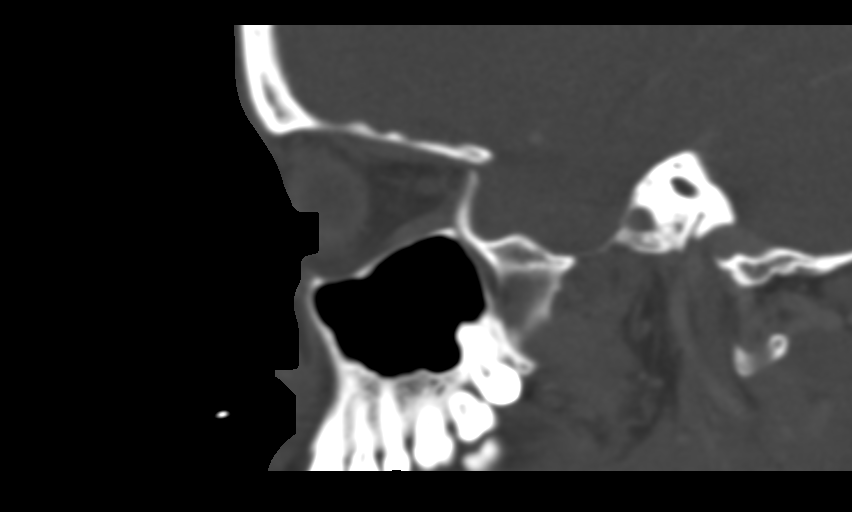

[14 of 47 positions shown; findings below may reference images not displayed]

FINDINGS: Orbits: There is left periorbital soft tissue swelling likely
reflecting left periorbital cellulitis. There is a centrally
hypodense and peripherally enhancing focus along the inferomedial
aspect of the left globe measuring 6 mm suspicious for preseptal
abscess (series 2, image 25). There is no definite CT evidence of
postseptal extension of infection on the current examination. The
globes are normal in size and contour. The extraocular muscles are
symmetric.

Visualized sinuses: No significant paranasal sinus disease or
mastoid effusion.

Soft tissues: Left periorbital soft tissue swelling as described.
The visualized maxillofacial and upper neck soft tissues are
otherwise unremarkable.

Limited intracranial: No acute intracranial abnormality identified.
IMPRESSION: Left periorbital soft tissue swelling likely reflecting periorbital
cellulitis. Suspected 6 mm preseptal abscess along the inferomedial
aspect of the left globe as described. No definite CT evidence of
post-septal extension of infection on the current examination.
